# Patient Record
Sex: Male | Born: 1955
Health system: Southern US, Community
[De-identification: ages and names within clinical notes are randomized; demographics above are authoritative.]

## PROBLEM LIST (undated history)

## (undated) DIAGNOSIS — M51369 Other intervertebral disc degeneration, lumbar region without mention of lumbar back pain or lower extremity pain: Secondary | ICD-10-CM

## (undated) DIAGNOSIS — I700 Atherosclerosis of aorta: Secondary | ICD-10-CM

## (undated) DIAGNOSIS — L720 Epidermal cyst: Secondary | ICD-10-CM

## (undated) DIAGNOSIS — M5136 Other intervertebral disc degeneration, lumbar region: Secondary | ICD-10-CM

## (undated) DIAGNOSIS — K760 Fatty (change of) liver, not elsewhere classified: Secondary | ICD-10-CM

## (undated) DIAGNOSIS — K4021 Bilateral inguinal hernia, without obstruction or gangrene, recurrent: Secondary | ICD-10-CM

## (undated) DIAGNOSIS — Z86010 Personal history of colonic polyps: Secondary | ICD-10-CM

## (undated) DIAGNOSIS — F341 Dysthymic disorder: Secondary | ICD-10-CM

## (undated) DIAGNOSIS — F419 Anxiety disorder, unspecified: Secondary | ICD-10-CM

## (undated) DIAGNOSIS — M4802 Spinal stenosis, cervical region: Secondary | ICD-10-CM

## (undated) DIAGNOSIS — M25552 Pain in left hip: Secondary | ICD-10-CM

## (undated) DIAGNOSIS — F5105 Insomnia due to other mental disorder: Secondary | ICD-10-CM

## (undated) DIAGNOSIS — Z860101 Personal history of adenomatous and serrated colon polyps: Secondary | ICD-10-CM

## (undated) DIAGNOSIS — R1084 Generalized abdominal pain: Secondary | ICD-10-CM

## (undated) DIAGNOSIS — F411 Generalized anxiety disorder: Secondary | ICD-10-CM

## (undated) DIAGNOSIS — E669 Obesity, unspecified: Secondary | ICD-10-CM

## (undated) DIAGNOSIS — E66811 Obesity, class 1: Secondary | ICD-10-CM

## (undated) DIAGNOSIS — M170 Bilateral primary osteoarthritis of knee: Secondary | ICD-10-CM

## (undated) DIAGNOSIS — J309 Allergic rhinitis, unspecified: Secondary | ICD-10-CM

## (undated) HISTORY — DX: Generalized anxiety disorder: F41.1

## (undated) HISTORY — PX: KNEE ARTHROSCOPY: SUR90

## (undated) HISTORY — DX: Obesity, unspecified: E66.9

## (undated) HISTORY — DX: Dysthymic disorder: F34.1

## (undated) HISTORY — DX: Generalized abdominal pain: R10.84

## (undated) HISTORY — DX: Bilateral inguinal hernia, without obstruction or gangrene, recurrent: K40.21

## (undated) HISTORY — DX: Bilateral primary osteoarthritis of knee: M17.0

## (undated) HISTORY — DX: Pain in left hip: M25.552

## (undated) HISTORY — DX: Allergic rhinitis, unspecified: J30.9

## (undated) HISTORY — DX: Spinal stenosis, cervical region: M48.02

## (undated) HISTORY — PX: TOTAL SHOULDER REPLACEMENT: SUR1217

## (undated) HISTORY — DX: Personal history of adenomatous and serrated colon polyps: Z86.0101

## (undated) HISTORY — DX: Other intervertebral disc degeneration, lumbar region without mention of lumbar back pain or lower extremity pain: M51.369

## (undated) HISTORY — DX: Personal history of colonic polyps: Z86.010

## (undated) HISTORY — DX: Insomnia due to other mental disorder: F51.05

## (undated) HISTORY — DX: Obesity, class 1: E66.811

## (undated) HISTORY — PX: COLONOSCOPY: SHX174

## (undated) HISTORY — DX: Anxiety disorder, unspecified: F41.9

## (undated) HISTORY — DX: Epidermal cyst: L72.0

## (undated) HISTORY — DX: Other intervertebral disc degeneration, lumbar region: M51.36

---
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
## 1983-10-22 HISTORY — PX: TONSILLECTOMY AND ADENOIDECTOMY: SHX28

## 2010-10-21 HISTORY — PX: BACK SURGERY: SHX140

## 2011-05-02 DIAGNOSIS — M5416 Radiculopathy, lumbar region: Secondary | ICD-10-CM | POA: Insufficient documentation

## 2012-02-13 LAB — CBC AND DIFFERENTIAL
HCT: 45 (ref 41–53)
Hemoglobin: 14.9 (ref 13.5–17.5)
Neutrophils Absolute: 5
Platelets: 175 (ref 150–399)
WBC: 9.1

## 2012-02-13 LAB — HEPATIC FUNCTION PANEL
ALT: 48 — AB (ref 10–40)
AST: 17 (ref 14–40)
Alkaline Phosphatase: 88 (ref 25–125)
Bilirubin, Total: 0.6

## 2012-02-13 LAB — BASIC METABOLIC PANEL
BUN: 14 (ref 4–21)
CREATININE: 1 (ref <=1.3)
GLUCOSE: 83
POTASSIUM: 3.9 (ref 3.4–5.3)
SODIUM: 145 (ref 137–147)

## 2012-02-13 LAB — LIPID PANEL
CHOLESTEROL: 197 (ref 0–200)
HDL: 59 (ref 35–70)
LDL Cholesterol: 122
Triglycerides: 81 (ref 40–160)

## 2012-02-13 LAB — PSA: PSA: 1.22

## 2012-02-13 LAB — TSH: TSH: 2.78 (ref <=5.90)

## 2012-05-20 LAB — HM COLONOSCOPY

## 2017-05-28 ENCOUNTER — Encounter: Payer: Self-pay | Admitting: *Deleted

## 2017-05-28 ENCOUNTER — Telehealth: Payer: Self-pay | Admitting: *Deleted

## 2017-05-28 NOTE — Telephone Encounter (Signed)
New Pt apt 06/05/17.  Received records form [facility] - Dr. Modesto Charon.  I have reviewed these records and abstracted information into pts chart.   Records have been put on Dr. Isla Pence desk for review.

## 2017-06-01 ENCOUNTER — Encounter: Payer: Self-pay | Admitting: Family Medicine

## 2017-06-04 NOTE — Progress Notes (Signed)
Office Note 06/05/2017  CC:  Chief Complaint  Patient presents with  . Establish Care  . Abdominal Pain    lower, more left side than right, hernia?  . Neck Pain    left side   HPI:  Anthony Barnett is a 61 y.o. male who is here to establish care Patient's most recent primary MD: Dr. Drucilla Chalet in [place]. Old records from Dr. Felipa Emory in [facility] were reviewed prior to or during today's visit.  Has been having an uncomfortable feeling in abd 3-4 mo ago, constant, more L than R side.  Varies in intensity, describes it as vague "discomfort".  No change in relation to meals. Some burn on L lower/groin area where he had hernia repair in the remote past.  Worse when he gets tired after lots of activity.  No n/v.  No constip/diarrhea, or bloody/melanotic stools.  No meds taken for the sx's.   Has been physical more lately with moving from [place] --around the time this started. He has never had this before. No abnl weight loss. Takes 3-4 tabs twice a day ibuprofen.  Past Medical History:  Diagnosis Date  . DDD (degenerative disc disease), lumbar   . Dysthymia   . Epidermoid cyst of skin   . GAD (generalized anxiety disorder)   . Hx of adenomatous polyp of colon 2008; 2013   Recall 05/2017 (Dr. Hervey Ard, [place], [place].  . Insomnia secondary to anxiety    stress mainly related to work    Past Surgical History:  Procedure Laterality Date  . APPENDECTOMY  1968  . BACK SURGERY  2012  . COLONOSCOPY  2008'; 05/20/2012   2008; adenomatous polyps.  2013 Tubular adenoma:--recall 5 yrs.    Diverticulosis - Dr. Kellie Moor  . HERNIA REPAIR  1977   L inguinal  . KNEE SURGERY     L x 2, R x1  . TONSILLECTOMY AND ADENOIDECTOMY  1985    Family History  Problem Relation Age of Onset  . Colon cancer Maternal Grandfather 43    Social History   Social History  . Marital status: Married    Spouse name: N/A  . Number of children: N/A   . Years of education: N/A   Occupational History  . Not on file.   Social History Main Topics  . Smoking status: Never Smoker  . Smokeless tobacco: Never Used  . Alcohol use 1.8 oz/week    3 Glasses of wine per week  . Drug use: No  . Sexual activity: Not on file   Other Topics Concern  . Not on file   Social History Narrative   Married, 1 daughter and 1 son.   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   No tobacco.   Alc: occ wine.    Outpatient Encounter Prescriptions as of 06/05/2017  Medication Sig  . [DISCONTINUED] Ibuprofen (ADVIL PO) Take 1 tablet by mouth 2 (two) times daily.  . pantoprazole (PROTONIX) 40 MG tablet Take 1 tablet (40 mg total) by mouth daily.  . [DISCONTINUED] meloxicam (MOBIC) 15 MG tablet Take 1 tablet (15 mg total) by mouth daily.   No facility-administered encounter medications on file as of 06/05/2017.     No Known Allergies  ROS Review of Systems  Constitutional: Negative for appetite change, chills, fatigue and fever.  HENT: Negative for congestion, dental problem, ear pain and sore throat.   Eyes: Negative for discharge, redness and visual  disturbance.  Respiratory: Negative for cough, chest tightness, shortness of breath and wheezing.   Cardiovascular: Negative for chest pain, palpitations and leg swelling.  Gastrointestinal: Negative for abdominal distention, blood in stool, diarrhea, nausea and vomiting.  Genitourinary: Negative for difficulty urinating, dysuria, flank pain, frequency, hematuria and urgency.  Musculoskeletal: Negative for arthralgias, back pain, joint swelling, myalgias and neck stiffness.  Skin: Negative for pallor and rash.  Neurological: Negative for dizziness, speech difficulty, weakness and headaches.  Hematological: Negative for adenopathy. Does not bruise/bleed easily.  Psychiatric/Behavioral: Negative for confusion and sleep disturbance. The patient is not nervous/anxious.      PE; Blood pressure 125/82, pulse 73, temperature 98.5 F (36.9 C), temperature source Oral, resp. rate 16, height 5' 11.5" (1.816 m), weight 245 lb 12 oz (111.5 kg), SpO2 96 %. Body mass index is 33.8 kg/m.  Gen: Alert, well appearing.  Patient is oriented to person, place, time, and situation. AFFECT: pleasant, lucid thought and speech. Neck - No masses or thyromegaly or limitation in range of motion CV: RRR, no m/r/g.   LUNGS: CTA bilat, nonlabored resps, good aeration in all lung fields. ABD: soft, nondistended, BS normal, no HSM, bruit, or mass.  He has diffuse TTP all over abdomen, worse on L side, particularly LUQ.  No guarding or rebound tenderness.  With abd crunch while I'm palpating abdomen, his tenderness lessens a little bit. Left groin: subtle palpable fullness of tissue in area of prior L inguinal hernia.  Minimal tenderness here.  Scrotum/testicles normal.  With coughing forcefully, the left groin tissue does not change.   EXT: no clubbing, cyanosis, or edema.   Pertinent labs:  None today.  ASSESSMENT AND PLAN:   New pt; reviewed prior PCP records.  1) Abd pain/discomfort; diffuse, but LUQ and LLQ worse. Given his ibuprofen use, he very possibly has gastritis/dyspepsia, but this would not likely cause LLQ pain. Question slight recurrence of L inguinal hernia.   Will treat with pantoprazole 40 mg qd and stop all NSAIDs.  Check CBC w/diff, CMET, lipase, and ESR. We'll see if he gets some improvement in sx's over the next 3-4 weeks, keeping in mind we may have to look further into possible small recurrence of L inguinal hernia.  An After Visit Summary was printed and given to the patient.  Return for 3-4 week f/u abd problems.  (Of note, his last CPE with labs was >5 yrs ago per his report today, and he is due for repeat colonoscopy, so CPE with fasting labs + GI referral will need to be done pretty soon).  Signed:  Crissie Sickles, MD           06/05/2017

## 2017-06-05 ENCOUNTER — Encounter: Payer: Self-pay | Admitting: Family Medicine

## 2017-06-05 ENCOUNTER — Ambulatory Visit (INDEPENDENT_AMBULATORY_CARE_PROVIDER_SITE_OTHER): Payer: BLUE CROSS/BLUE SHIELD | Admitting: Family Medicine

## 2017-06-05 VITALS — BP 125/82 | HR 73 | Temp 98.5°F | Resp 16 | Ht 71.5 in | Wt 245.8 lb

## 2017-06-05 DIAGNOSIS — R1084 Generalized abdominal pain: Secondary | ICD-10-CM

## 2017-06-05 DIAGNOSIS — K2900 Acute gastritis without bleeding: Secondary | ICD-10-CM

## 2017-06-05 LAB — LIPASE: Lipase: 29 U/L (ref 11.0–59.0)

## 2017-06-05 LAB — SEDIMENTATION RATE: Sed Rate: 6 mm/hr (ref 0–20)

## 2017-06-05 LAB — CBC WITH DIFFERENTIAL/PLATELET
BASOS PCT: 0.4 % (ref 0.0–3.0)
Basophils Absolute: 0 10*3/uL (ref 0.0–0.1)
EOS PCT: 1.3 % (ref 0.0–5.0)
Eosinophils Absolute: 0.1 10*3/uL (ref 0.0–0.7)
HCT: 39.2 % (ref 39.0–52.0)
Hemoglobin: 13.1 g/dL (ref 13.0–17.0)
LYMPHS ABS: 2.5 10*3/uL (ref 0.7–4.0)
Lymphocytes Relative: 33.3 % (ref 12.0–46.0)
MCHC: 33.3 g/dL (ref 30.0–36.0)
MCV: 92.1 fl (ref 78.0–100.0)
MONO ABS: 0.6 10*3/uL (ref 0.1–1.0)
MONOS PCT: 8.4 % (ref 3.0–12.0)
NEUTROS ABS: 4.3 10*3/uL (ref 1.4–7.7)
NEUTROS PCT: 56.6 % (ref 43.0–77.0)
PLATELETS: 160 10*3/uL (ref 150.0–400.0)
RBC: 4.26 Mil/uL (ref 4.22–5.81)
RDW: 13.4 % (ref 11.5–15.5)
WBC: 7.6 10*3/uL (ref 4.0–10.5)

## 2017-06-05 LAB — COMPREHENSIVE METABOLIC PANEL
ALK PHOS: 63 U/L (ref 39–117)
ALT: 31 U/L (ref 0–53)
AST: 21 U/L (ref 0–37)
Albumin: 3.8 g/dL (ref 3.5–5.2)
BUN: 20 mg/dL (ref 6–23)
CO2: 31 meq/L (ref 19–32)
Calcium: 9 mg/dL (ref 8.4–10.5)
Chloride: 106 mEq/L (ref 96–112)
Creatinine, Ser: 0.84 mg/dL (ref 0.40–1.50)
GFR: 99.06 mL/min (ref >60.00)
GLUCOSE: 93 mg/dL (ref 70–99)
POTASSIUM: 4.3 meq/L (ref 3.5–5.1)
SODIUM: 142 meq/L (ref 135–145)
TOTAL PROTEIN: 6.1 g/dL (ref 6.0–8.3)
Total Bilirubin: 0.4 mg/dL (ref 0.2–1.2)

## 2017-06-05 MED ORDER — MELOXICAM 15 MG PO TABS: 15.0000 mg | ORAL_TABLET | Freq: Every day | ORAL | 0 refills | Status: DC

## 2017-06-05 MED ORDER — PANTOPRAZOLE SODIUM 40 MG PO TBEC: 40.0000 mg | DELAYED_RELEASE_TABLET | Freq: Every day | ORAL | 3 refills | Status: DC

## 2017-06-10 ENCOUNTER — Other Ambulatory Visit: Payer: Self-pay | Admitting: *Deleted

## 2017-06-10 NOTE — Telephone Encounter (Signed)
Opened in error

## 2017-06-24 ENCOUNTER — Ambulatory Visit: Payer: BLUE CROSS/BLUE SHIELD | Admitting: Family Medicine

## 2017-07-31 ENCOUNTER — Ambulatory Visit (INDEPENDENT_AMBULATORY_CARE_PROVIDER_SITE_OTHER): Payer: BLUE CROSS/BLUE SHIELD

## 2017-07-31 DIAGNOSIS — Z23 Encounter for immunization: Secondary | ICD-10-CM

## 2017-08-01 DIAGNOSIS — M1711 Unilateral primary osteoarthritis, right knee: Secondary | ICD-10-CM | POA: Diagnosis not present

## 2017-08-01 DIAGNOSIS — M1712 Unilateral primary osteoarthritis, left knee: Secondary | ICD-10-CM | POA: Diagnosis not present

## 2017-08-04 ENCOUNTER — Telehealth: Payer: Self-pay | Admitting: *Deleted

## 2017-08-04 ENCOUNTER — Telehealth: Payer: Self-pay | Admitting: Internal Medicine

## 2017-08-04 ENCOUNTER — Ambulatory Visit (INDEPENDENT_AMBULATORY_CARE_PROVIDER_SITE_OTHER): Payer: BLUE CROSS/BLUE SHIELD | Admitting: Family Medicine

## 2017-08-04 ENCOUNTER — Encounter: Payer: Self-pay | Admitting: Family Medicine

## 2017-08-04 VITALS — BP 145/86 | HR 67 | Temp 98.2°F | Resp 16 | Ht 71.5 in | Wt 241.5 lb

## 2017-08-04 DIAGNOSIS — Z01818 Encounter for other preprocedural examination: Secondary | ICD-10-CM

## 2017-08-04 DIAGNOSIS — Z86010 Personal history of colonic polyps: Secondary | ICD-10-CM

## 2017-08-04 NOTE — Progress Notes (Signed)
Office Note 08/04/2017  CC:  Chief Complaint  Patient presents with  . Medical Clearance    Left total knee arthroplasty    HPI:  Anthony Barnett is a 61 y.o. White male who is here for medical clearance for a left TKA that is not scheduled yet (Dr. Lyla Glassing). Has had no problems with prior surgeries Physical activity somewhat limited due to bilat knee pains. He has no SOB or CP or other sx's with sex, walking up stairs, doing vigorous yard work. No hx of CP issues or DOE. No hx of CAD, heart valve issues, or impaired LV function.  He takes no meds regularly. Checks bp regularly at home and says it is always 120s/70s.  Past Medical History:  Diagnosis Date  . DDD (degenerative disc disease), lumbar   . Dysthymia   . Epidermoid cyst of skin   . GAD (generalized anxiety disorder)   . Hx of adenomatous polyp of colon 2008; 2013   Recall 05/2017 (Dr. Hervey Ard, [place], [place].  . Insomnia secondary to anxiety    stress mainly related to work    Past Surgical History:  Procedure Laterality Date  . APPENDECTOMY  1968  . BACK SURGERY  2012  . COLONOSCOPY  2008'; 05/20/2012   2008; adenomatous polyps.  2013 Tubular adenoma:--recall 5 yrs.    Diverticulosis - Dr. Kellie Moor  . HERNIA REPAIR  1977   L inguinal  . KNEE SURGERY     L x 2, R x1  . TONSILLECTOMY AND ADENOIDECTOMY  1985    Family History  Problem Relation Age of Onset  . Colon cancer Maternal Grandfather 79    Social History   Social History  . Marital status: Married    Spouse name: N/A  . Number of children: N/A  . Years of education: N/A   Occupational History  . Not on file.   Social History Main Topics  . Smoking status: Never Smoker  . Smokeless tobacco: Never Used  . Alcohol use 1.8 oz/week    3 Glasses of wine per week  . Drug use: No  . Sexual activity: Not on file   Other Topics Concern  . Not on file   Social History Narrative   Married, 1  daughter and 1 son.   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   No tobacco.   Alc: occ wine.    Outpatient Medications Prior to Visit  Medication Sig Dispense Refill  . pantoprazole (PROTONIX) 40 MG tablet Take 1 tablet (40 mg total) by mouth daily. (Patient not taking: Reported on 08/04/2017) 30 tablet 3   No facility-administered medications prior to visit.     No Known Allergies  ROS Review of Systems  Constitutional: Negative for appetite change, chills, fatigue and fever.  HENT: Negative for congestion, dental problem, ear pain and sore throat.   Eyes: Negative for discharge, redness and visual disturbance.  Respiratory: Negative for cough, chest tightness, shortness of breath and wheezing.   Cardiovascular: Negative for chest pain, palpitations and leg swelling.  Gastrointestinal: Negative for abdominal pain, blood in stool, diarrhea, nausea and vomiting.  Genitourinary: Negative for difficulty urinating, dysuria, flank pain, frequency, hematuria and urgency.  Musculoskeletal: Positive for arthralgias (bilat knees--chronic). Negative for back pain, joint swelling, myalgias and neck stiffness.  Skin: Negative for pallor and rash.  Neurological: Negative for dizziness, speech difficulty, weakness and headaches.  Hematological: Negative for adenopathy. Does not bruise/bleed easily.  Psychiatric/Behavioral: Negative for confusion and sleep disturbance. The patient is not nervous/anxious.     PE; Blood pressure (!) 145/86, pulse 67, temperature 98.2 F (36.8 C), temperature source Oral, resp. rate 16, height 5' 11.5" (1.816 m), weight 241 lb 8 oz (109.5 kg), SpO2 96 %. Gen: Alert, well appearing.  Patient is oriented to person, place, time, and situation. AFFECT: pleasant, lucid thought and speech. ENT:  Eyes: no injection, icteris, swelling, or exudate.  EOMI, PERRLA. Nose: no drainage or turbinate edema/swelling.  No injection or focal lesion.   Mouth: lips without lesion/swelling.  Oral mucosa pink and moist.  Dentition intact and without obvious caries or gingival swelling.  Oropharynx without erythema, exudate, or swelling.  Neck: supple/nontender.  No LAD, mass, or TM.  Carotid pulses 2+ bilaterally, without bruits. CV: RRR, no m/r/g.   LUNGS: CTA bilat, nonlabored resps, good aeration in all lung fields. ABD: soft, NT, ND, BS normal.  No hepatospenomegaly or mass.  No bruits. EXT: no clubbing, cyanosis, or edema.  Musculoskeletal: no joint swelling, erythema, warmth, or tenderness.  ROM of all joints intact. Skin - no sores or suspicious lesions or rashes or color changes   Pertinent labs:  Lab Results  Component Value Date   TSH 2.78 02/13/2012   Lab Results  Component Value Date   WBC 7.6 06/05/2017   HGB 13.1 06/05/2017   HCT 39.2 06/05/2017   MCV 92.1 06/05/2017   PLT 160.0 06/05/2017   Lab Results  Component Value Date   CREATININE 0.84 06/05/2017   BUN 20 06/05/2017   NA 142 06/05/2017   K 4.3 06/05/2017   CL 106 06/05/2017   CO2 31 06/05/2017   Lab Results  Component Value Date   ALT 31 06/05/2017   AST 21 06/05/2017   ALKPHOS 63 06/05/2017   BILITOT 0.4 06/05/2017   Lab Results  Component Value Date   CHOL 197 02/13/2012   Lab Results  Component Value Date   HDL 59 02/13/2012   Lab Results  Component Value Date   LDLCALC 122 02/13/2012   Lab Results  Component Value Date   TRIG 81 02/13/2012   No results found for: [facility] Lab Results  Component Value Date   PSA 1.22 02/13/2012   ASSESSMENT AND PLAN:   1) Pre-operative medical clearance for TKA. He has no symptoms or signs of cardiopulmonary dz, no need for any w/u along these lines. He has had normal CBC and CMET 2 mo ago. He takes no medications. He is cleared for surgery w/out any specific recommendations.  2) Colon polyps, hx of.  He is due for his 5 yr f/u colonoscopy. Will refer to local GI for this.  An After Visit  Summary was printed and given to the patient.  FOLLOW UP:  Return in about 1 year (around 08/04/2018) for annual CPE (fasting).  Signed:  Crissie Sickles, MD           08/04/2017

## 2017-08-04 NOTE — Telephone Encounter (Signed)
Medical Clearance form completed and faxed to Dr. Sherrie George office.   Original form sent to scan. Copy made and put in fax pile, will be put in shred box x 1 month.

## 2017-08-06 ENCOUNTER — Ambulatory Visit: Payer: Self-pay | Admitting: Orthopedic Surgery

## 2017-08-06 NOTE — Telephone Encounter (Signed)
Called patient and LM on Vmail.  Dr. Hilarie Fredrickson is requesting Pathology results from previous Colon and they are not scanned in [facility]

## 2017-08-26 NOTE — Telephone Encounter (Signed)
LM on Vmail to check the status of Pathology report from 2013.

## 2017-09-04 NOTE — Pre-Procedure Instructions (Signed)
Anthony Barnett  09/04/2017      MED[facility] [place], [place] - [address] [place] [address] Phone: [phone] Fax: [phone]    Your procedure is scheduled on Monday, September 15, 2017  Report to [facility] Admitting Entrance "A" at 1:40P.M.   Call this number if you have problems the morning of surgery:  [phone]   Remember:  Do not eat food or drink liquids after midnight.  Take these medicines the morning of surgery with A SIP OF WATER: None  As of today, stop taking all Aspirins, Vitamins, Fish oils, and Herbal medications. Also stop all NSAIDS i.e. Advil, Ibuprofen, Motrin, Aleve, Anaprox, Naproxen, BC and Goody Powders.   Do not wear jewelry.  Do not wear lotions, powders, colognes, or deodorant.  Do not shave 48 hours prior to surgery.  Men may shave face and neck.  Do not bring valuables to the hospital.  [facility] is not responsible for any belongings or valuables.  Contacts, dentures or bridgework may not be worn into surgery.  Leave your suitcase in the car.  After surgery it may be brought to your room.  For patients admitted to the hospital, discharge time will be determined by your treatment team.  Patients discharged the day of surgery will not be allowed to drive home.   Special instructions:  [place]- Preparing For Surgery  Before surgery, you can play an important role. Because skin is not sterile, your skin needs to be as free of germs as possible. You can reduce the number of germs on your skin by washing with CHG (chlorahexidine gluconate) Soap before surgery.  CHG is an antiseptic cleaner which kills germs and bonds with the skin to continue killing germs even after washing.  Please do not use if you have an allergy to CHG or antibacterial soaps. If your skin becomes reddened/irritated stop using the CHG.  Do not shave (including legs and underarms) for at least 48 hours prior to first CHG shower.  It is OK to shave your face.  Please follow these instructions carefully.   1. Shower the NIGHT BEFORE SURGERY and the MORNING OF SURGERY with CHG.   2. If you chose to wash your hair, wash your hair first as usual with your normal shampoo.  3. After you shampoo, rinse your hair and body thoroughly to remove the shampoo.  4. Use CHG as you would any other liquid soap. You can apply CHG directly to the skin and wash gently with a scrungie or a clean washcloth.   5. Apply the CHG Soap to your body ONLY FROM THE NECK DOWN.  Do not use on open wounds or open sores. Avoid contact with your eyes, ears, mouth and genitals (private parts). Wash Face and genitals (private parts)  with your normal soap.  6. Wash thoroughly, paying special attention to the area where your surgery will be performed.  7. Thoroughly rinse your body with warm water from the neck down.  8. DO NOT shower/wash with your normal soap after using and rinsing off the CHG Soap.  9. Pat yourself dry with a CLEAN TOWEL.  10. Wear CLEAN PAJAMAS to bed the night before surgery, wear comfortable clothes the morning of surgery  11. Place CLEAN SHEETS on your bed the night of your first shower and DO NOT SLEEP WITH PETS.  Day of Surgery: Do not apply any deodorants/lotions. Please wear clean clothes to  the hospital/surgery center.    Please read over the following fact sheets that you were given. Pain Booklet, Coughing and Deep Breathing, MRSA Information and Surgical Site Infection Prevention

## 2017-09-05 ENCOUNTER — Encounter (HOSPITAL_COMMUNITY)
Admission: RE | Admit: 2017-09-05 | Discharge: 2017-09-05 | Disposition: A | Discharge status: Home / Self Care | Payer: BLUE CROSS/BLUE SHIELD | Source: Ambulatory Visit | Attending: Orthopedic Surgery | Admitting: Orthopedic Surgery

## 2017-09-05 ENCOUNTER — Other Ambulatory Visit: Payer: Self-pay

## 2017-09-05 ENCOUNTER — Encounter (HOSPITAL_COMMUNITY): Payer: Self-pay

## 2017-09-05 DIAGNOSIS — Z01812 Encounter for preprocedural laboratory examination: Secondary | ICD-10-CM | POA: Insufficient documentation

## 2017-09-05 LAB — CBC
HCT: 43.3 % (ref 39.0–52.0)
Hemoglobin: 14.6 g/dL (ref 13.0–17.0)
MCH: 30.9 pg (ref 26.0–34.0)
MCHC: 33.7 g/dL (ref 30.0–36.0)
MCV: 91.7 fL (ref 78.0–100.0)
PLATELETS: 143 10*3/uL — AB (ref 150–400)
RBC: 4.72 MIL/uL (ref 4.22–5.81)
RDW: 13.5 % (ref 11.5–15.5)
WBC: 7.1 10*3/uL (ref 4.0–10.5)

## 2017-09-05 LAB — BASIC METABOLIC PANEL
ANION GAP: 5 (ref 5–15)
BUN: 16 mg/dL (ref 6–20)
CALCIUM: 8.8 mg/dL — AB (ref 8.9–10.3)
CO2: 27 mmol/L (ref 22–32)
CREATININE: 0.89 mg/dL (ref 0.61–1.24)
Chloride: 107 mmol/L (ref 101–111)
GFR calc Af Amer: >60 mL/min (ref >60)
GLUCOSE: 99 mg/dL (ref 65–99)
Potassium: 4.4 mmol/L (ref 3.5–5.1)
Sodium: 139 mmol/L (ref 135–145)

## 2017-09-05 LAB — SURGICAL PCR SCREEN
MRSA, PCR: NEGATIVE
Staphylococcus aureus: NEGATIVE

## 2017-09-05 LAB — TYPE AND SCREEN
ABO/RH(D): A POS
Antibody Screen: NEGATIVE

## 2017-09-05 LAB — ABO/RH: ABO/RH(D): A POS

## 2017-09-05 NOTE — Progress Notes (Signed)
PCP - Dr. Patrcia Dolly- [place]  Cardiologist - Denies  Chest x-ray - Denies  EKG - Denies  Stress Test - Denies  ECHO - Denies  Cardiac Cath - Denies  Sleep Study - No CPAP - None    Pt denies having chest pain, sob, or fever at this time. All instructions explained to the pt, with a verbal understanding of the material. Pt agrees to go over the instructions while at home for a better understanding. The opportunity to ask questions was provided.

## 2017-09-09 ENCOUNTER — Ambulatory Visit: Payer: Self-pay | Admitting: Orthopedic Surgery

## 2017-09-09 NOTE — H&P (View-Only) (Signed)
TOTAL KNEE ADMISSION H&P  Patient is being admitted for left total knee arthroplasty.  Subjective:  Chief Complaint:left knee pain.  HPI: Anthony Barnett, 61 y.o. male, has a history of pain and functional disability in the left knee due to arthritis and has failed non-surgical conservative treatments for greater than 12 weeks to includeNSAID's and/or analgesics, corticosteriod injections, flexibility and strengthening excercises, use of assistive devices, weight reduction as appropriate and activity modification.  Onset of symptoms was gradual, starting >10 years ago with gradually worsening course since that time. The patient noted prior procedures on the knee to include  arthroscopy and menisectomy on the left knee(s).  Patient currently rates pain in the left knee(s) at 10 out of 10 with activity. Patient has night pain, worsening of pain with activity and weight bearing, pain that interferes with activities of daily living, pain with passive range of motion, crepitus and joint swelling.  Patient has evidence of subchondral cysts, subchondral sclerosis, periarticular osteophytes and joint space narrowing by imaging studies.  There is no active infection.  There are no active problems to display for this patient.  Past Medical History:  Diagnosis Date  . Arthritis of knee, left   . DDD (degenerative disc disease), lumbar   . Dysthymia   . Epidermoid cyst of skin   . GAD (generalized anxiety disorder)   . Hx of adenomatous polyp of colon 2008; 2013   Recall 05/2017 (Dr. Hervey Ard, [place], [place].  . Insomnia secondary to anxiety    stress mainly related to work    Past Surgical History:  Procedure Laterality Date  . APPENDECTOMY  1968  . BACK SURGERY  2012  . COLONOSCOPY  2008'; 05/20/2012   2008; adenomatous polyps.  2013 Tubular adenoma:--recall 5 yrs.    Diverticulosis - Dr. Kellie Moor  . HERNIA REPAIR  1977   L inguinal  . JOINT REPLACEMENT    .  KNEE ARTHROSCOPY     Bilateral  . KNEE SURGERY     L x 2, R x1  . TONSILLECTOMY AND ADENOIDECTOMY  1985  . TOTAL SHOULDER REPLACEMENT     Left    Current Outpatient Medications  Medication Sig Dispense Refill Last Dose  . ibuprofen (ADVIL,MOTRIN) 200 MG tablet Take 400 mg every 8 (eight) hours as needed by mouth for mild pain (takes at least once every day).      No current facility-administered medications for this visit.    No Known Allergies  Social History   Tobacco Use  . Smoking status: Never Smoker  . Smokeless tobacco: Never Used  Substance Use Topics  . Alcohol use: Yes    Alcohol/week: 1.8 oz    Types: 3 Glasses of wine per week    Family History  Problem Relation Age of Onset  . Colon cancer Maternal Grandfather 85     Review of Systems  Constitutional: Negative.   HENT: Negative.   Eyes: Negative.   Respiratory: Negative.   Cardiovascular: Negative.   Gastrointestinal: Negative.   Genitourinary: Negative.   Musculoskeletal: Positive for joint pain.  Skin: Negative.   Neurological: Negative.   Endo/Heme/Allergies: Negative.   Psychiatric/Behavioral: Negative.     Objective:  Physical Exam  Vitals reviewed. Constitutional: He is oriented to person, place, and time. He appears well-developed and well-nourished.  HENT:  Head: Normocephalic and atraumatic.  Eyes: Conjunctivae and EOM are normal. Pupils are equal, round, and reactive to light.  Neck: Normal range of motion. Neck supple. No thyromegaly present.  Cardiovascular: Normal rate, regular rhythm and intact distal pulses.  Respiratory: Effort normal. No respiratory distress.  Genitourinary:  Genitourinary Comments: deferred  Musculoskeletal:       Left knee: He exhibits decreased range of motion, swelling, effusion, abnormal alignment and bony tenderness. Tenderness found. Medial joint line and lateral joint line tenderness noted.  Neurological: He is alert and oriented to person, place, and  time. He has normal reflexes.  Skin: Skin is warm and dry.  Psychiatric: He has a normal mood and affect. His behavior is normal. Judgment and thought content normal.    Vital signs in last 24 hours: @VSRANGES @  Labs:   Estimated body mass index is 33.32 kg/m as calculated from the following:   Height as of 09/05/17: 6' (1.829 m).   Weight as of 09/05/17: 111.4 kg (245 lb 11.2 oz).   Imaging Review Plain radiographs demonstrate severe degenerative joint disease of the left knee(s). The overall alignment issignificant varus. The bone quality appears to be adequate for age and reported activity level.  Assessment/Plan:  End stage arthritis, left knee   The patient history, physical examination, clinical judgment of the provider and imaging studies are consistent with end stage degenerative joint disease of the left knee(s) and total knee arthroplasty is deemed medically necessary. The treatment options including medical management, injection therapy arthroscopy and arthroplasty were discussed at length. The risks and benefits of total knee arthroplasty were presented and reviewed. The risks due to aseptic loosening, infection, stiffness, patella tracking problems, thromboembolic complications and other imponderables were discussed. The patient acknowledged the explanation, agreed to proceed with the plan and consent was signed. Patient is being admitted for inpatient treatment for surgery, pain control, PT, OT, prophylactic antibiotics, VTE prophylaxis, progressive ambulation and ADL's and discharge planning. The patient is planning to be discharged home with outpatient PT. Has DME.

## 2017-09-09 NOTE — H&P (Signed)
TOTAL KNEE ADMISSION H&P  Patient is being admitted for left total knee arthroplasty.  Subjective:  Chief Complaint:left knee pain.  HPI: Anthony Barnett, 61 y.o. male, has a history of pain and functional disability in the left knee due to arthritis and has failed non-surgical conservative treatments for greater than 12 weeks to includeNSAID's and/or analgesics, corticosteriod injections, flexibility and strengthening excercises, use of assistive devices, weight reduction as appropriate and activity modification.  Onset of symptoms was gradual, starting >10 years ago with gradually worsening course since that time. The patient noted prior procedures on the knee to include  arthroscopy and menisectomy on the left knee(s).  Patient currently rates pain in the left knee(s) at 10 out of 10 with activity. Patient has night pain, worsening of pain with activity and weight bearing, pain that interferes with activities of daily living, pain with passive range of motion, crepitus and joint swelling.  Patient has evidence of subchondral cysts, subchondral sclerosis, periarticular osteophytes and joint space narrowing by imaging studies.  There is no active infection.  There are no active problems to display for this patient.  Past Medical History:  Diagnosis Date  . Arthritis of knee, left   . DDD (degenerative disc disease), lumbar   . Dysthymia   . Epidermoid cyst of skin   . GAD (generalized anxiety disorder)   . Hx of adenomatous polyp of colon 2008; 2013   Recall 05/2017 (Dr. Hervey Ard, [place], [place].  . Insomnia secondary to anxiety    stress mainly related to work    Past Surgical History:  Procedure Laterality Date  . APPENDECTOMY  1968  . BACK SURGERY  2012  . COLONOSCOPY  2008'; 05/20/2012   2008; adenomatous polyps.  2013 Tubular adenoma:--recall 5 yrs.    Diverticulosis - Dr. Kellie Moor  . HERNIA REPAIR  1977   L inguinal  . JOINT REPLACEMENT    .  KNEE ARTHROSCOPY     Bilateral  . KNEE SURGERY     L x 2, R x1  . TONSILLECTOMY AND ADENOIDECTOMY  1985  . TOTAL SHOULDER REPLACEMENT     Left    Current Outpatient Medications  Medication Sig Dispense Refill Last Dose  . ibuprofen (ADVIL,MOTRIN) 200 MG tablet Take 400 mg every 8 (eight) hours as needed by mouth for mild pain (takes at least once every day).      No current facility-administered medications for this visit.    No Known Allergies  Social History   Tobacco Use  . Smoking status: Never Smoker  . Smokeless tobacco: Never Used  Substance Use Topics  . Alcohol use: Yes    Alcohol/week: 1.8 oz    Types: 3 Glasses of wine per week    Family History  Problem Relation Age of Onset  . Colon cancer Maternal Grandfather 85     Review of Systems  Constitutional: Negative.   HENT: Negative.   Eyes: Negative.   Respiratory: Negative.   Cardiovascular: Negative.   Gastrointestinal: Negative.   Genitourinary: Negative.   Musculoskeletal: Positive for joint pain.  Skin: Negative.   Neurological: Negative.   Endo/Heme/Allergies: Negative.   Psychiatric/Behavioral: Negative.     Objective:  Physical Exam  Vitals reviewed. Constitutional: He is oriented to person, place, and time. He appears well-developed and well-nourished.  HENT:  Head: Normocephalic and atraumatic.  Eyes: Conjunctivae and EOM are normal. Pupils are equal, round, and reactive to light.  Neck: Normal range of motion. Neck supple. No thyromegaly present.  Cardiovascular: Normal rate, regular rhythm and intact distal pulses.  Respiratory: Effort normal. No respiratory distress.  Genitourinary:  Genitourinary Comments: deferred  Musculoskeletal:       Left knee: He exhibits decreased range of motion, swelling, effusion, abnormal alignment and bony tenderness. Tenderness found. Medial joint line and lateral joint line tenderness noted.  Neurological: He is alert and oriented to person, place, and  time. He has normal reflexes.  Skin: Skin is warm and dry.  Psychiatric: He has a normal mood and affect. His behavior is normal. Judgment and thought content normal.    Vital signs in last 24 hours: @VSRANGES @  Labs:   Estimated body mass index is 33.32 kg/m as calculated from the following:   Height as of 09/05/17: 6' (1.829 m).   Weight as of 09/05/17: 111.4 kg (245 lb 11.2 oz).   Imaging Review Plain radiographs demonstrate severe degenerative joint disease of the left knee(s). The overall alignment issignificant varus. The bone quality appears to be adequate for age and reported activity level.  Assessment/Plan:  End stage arthritis, left knee   The patient history, physical examination, clinical judgment of the provider and imaging studies are consistent with end stage degenerative joint disease of the left knee(s) and total knee arthroplasty is deemed medically necessary. The treatment options including medical management, injection therapy arthroscopy and arthroplasty were discussed at length. The risks and benefits of total knee arthroplasty were presented and reviewed. The risks due to aseptic loosening, infection, stiffness, patella tracking problems, thromboembolic complications and other imponderables were discussed. The patient acknowledged the explanation, agreed to proceed with the plan and consent was signed. Patient is being admitted for inpatient treatment for surgery, pain control, PT, OT, prophylactic antibiotics, VTE prophylaxis, progressive ambulation and ADL's and discharge planning. The patient is planning to be discharged home with outpatient PT. Has DME.

## 2017-09-12 MED ORDER — TRANEXAMIC ACID 1000 MG/10ML IV SOLN
1000.0000 mg | INTRAVENOUS | Status: AC
  Administered 2017-09-15: 1000 mg via INTRAVENOUS
  Filled 2017-09-12: qty 10

## 2017-09-12 MED ORDER — ACETAMINOPHEN 10 MG/ML IV SOLN
1000.0000 mg | INTRAVENOUS | Status: AC
  Administered 2017-09-15: 1000 mg via INTRAVENOUS
  Filled 2017-09-12: qty 100

## 2017-09-12 MED ORDER — CEFAZOLIN SODIUM-DEXTROSE 2-4 GM/100ML-% IV SOLN
2.0000 g | INTRAVENOUS | Status: AC
  Administered 2017-09-15: 2 g via INTRAVENOUS
  Filled 2017-09-12: qty 100

## 2017-09-12 MED ORDER — SODIUM CHLORIDE 0.9 % IV SOLN: INTRAVENOUS | Status: DC

## 2017-09-15 ENCOUNTER — Inpatient Hospital Stay (HOSPITAL_COMMUNITY): Payer: BLUE CROSS/BLUE SHIELD

## 2017-09-15 ENCOUNTER — Encounter (HOSPITAL_COMMUNITY): Payer: Self-pay | Admitting: Surgery

## 2017-09-15 ENCOUNTER — Inpatient Hospital Stay (HOSPITAL_COMMUNITY): Payer: BLUE CROSS/BLUE SHIELD | Admitting: Anesthesiology

## 2017-09-15 ENCOUNTER — Inpatient Hospital Stay (HOSPITAL_COMMUNITY)
Admission: RE | Admit: 2017-09-15 | Discharge: 2017-09-16 | DRG: 470 | Disposition: A | Discharge status: Home / Self Care | Payer: BLUE CROSS/BLUE SHIELD | Source: Ambulatory Visit | Attending: Orthopedic Surgery | Admitting: Orthopedic Surgery

## 2017-09-15 ENCOUNTER — Other Ambulatory Visit: Payer: Self-pay

## 2017-09-15 ENCOUNTER — Encounter (HOSPITAL_COMMUNITY)
Admission: RE | Disposition: A | Discharge status: Home / Self Care | Payer: Self-pay | Source: Ambulatory Visit | Attending: Orthopedic Surgery

## 2017-09-15 DIAGNOSIS — Z96612 Presence of left artificial shoulder joint: Secondary | ICD-10-CM | POA: Diagnosis not present

## 2017-09-15 DIAGNOSIS — Z791 Long term (current) use of non-steroidal anti-inflammatories (NSAID): Secondary | ICD-10-CM | POA: Diagnosis not present

## 2017-09-15 DIAGNOSIS — F411 Generalized anxiety disorder: Secondary | ICD-10-CM | POA: Diagnosis present

## 2017-09-15 DIAGNOSIS — F329 Major depressive disorder, single episode, unspecified: Secondary | ICD-10-CM | POA: Diagnosis not present

## 2017-09-15 DIAGNOSIS — M1712 Unilateral primary osteoarthritis, left knee: Secondary | ICD-10-CM | POA: Diagnosis present

## 2017-09-15 DIAGNOSIS — D571 Sickle-cell disease without crisis: Secondary | ICD-10-CM | POA: Diagnosis present

## 2017-09-15 DIAGNOSIS — Z96652 Presence of left artificial knee joint: Secondary | ICD-10-CM | POA: Diagnosis not present

## 2017-09-15 DIAGNOSIS — F5105 Insomnia due to other mental disorder: Secondary | ICD-10-CM | POA: Diagnosis not present

## 2017-09-15 DIAGNOSIS — Z471 Aftercare following joint replacement surgery: Secondary | ICD-10-CM | POA: Diagnosis not present

## 2017-09-15 DIAGNOSIS — G8918 Other acute postprocedural pain: Secondary | ICD-10-CM | POA: Diagnosis not present

## 2017-09-15 HISTORY — PX: KNEE ARTHROPLASTY: SHX992

## 2017-09-15 SURGERY — ARTHROPLASTY, KNEE, TOTAL, USING IMAGELESS COMPUTER-ASSISTED NAVIGATION
Anesthesia: Spinal | Site: Knee | Laterality: Left

## 2017-09-15 MED ORDER — METHOCARBAMOL 1000 MG/10ML IJ SOLN
500.0000 mg | Freq: Four times a day (QID) | INTRAVENOUS | Status: DC | PRN
  Filled 2017-09-15: qty 5

## 2017-09-15 MED ORDER — ACETAMINOPHEN 325 MG PO TABS: 650.0000 mg | ORAL_TABLET | ORAL | Status: DC | PRN

## 2017-09-15 MED ORDER — POVIDONE-IODINE 10 % EX SWAB: 2.0000 "application " | Freq: Once | CUTANEOUS | Status: DC

## 2017-09-15 MED ORDER — PHENOL 1.4 % MT LIQD: 1.0000 | OROMUCOSAL | Status: DC | PRN

## 2017-09-15 MED ORDER — OXYCODONE HCL 5 MG/5ML PO SOLN: 5.0000 mg | Freq: Once | ORAL | Status: DC | PRN

## 2017-09-15 MED ORDER — ONDANSETRON HCL 4 MG/2ML IJ SOLN
INTRAMUSCULAR | Status: DC | PRN
  Administered 2017-09-15: 4 mg via INTRAVENOUS

## 2017-09-15 MED ORDER — FENTANYL CITRATE (PF) 100 MCG/2ML IJ SOLN
100.0000 ug | Freq: Once | INTRAMUSCULAR | Status: AC
  Administered 2017-09-15: 100 ug via INTRAVENOUS

## 2017-09-15 MED ORDER — HYDROCODONE-ACETAMINOPHEN 5-325 MG PO TABS: 2.0000 | ORAL_TABLET | ORAL | Status: DC | PRN

## 2017-09-15 MED ORDER — ALUM & MAG HYDROXIDE-SIMETH 200-200-20 MG/5ML PO SUSP: 30.0000 mL | ORAL | Status: DC | PRN

## 2017-09-15 MED ORDER — SODIUM CHLORIDE 0.9 % IV SOLN
INTRAVENOUS | Status: DC
  Administered 2017-09-15 – 2017-09-16 (×2): via INTRAVENOUS

## 2017-09-15 MED ORDER — FENTANYL CITRATE (PF) 100 MCG/2ML IJ SOLN
INTRAMUSCULAR | Status: DC | PRN
  Administered 2017-09-15: 50 ug via INTRAVENOUS

## 2017-09-15 MED ORDER — FENTANYL CITRATE (PF) 250 MCG/5ML IJ SOLN
INTRAMUSCULAR | Status: AC
  Filled 2017-09-15: qty 5

## 2017-09-15 MED ORDER — METOCLOPRAMIDE HCL 5 MG PO TABS: 5.0000 mg | ORAL_TABLET | Freq: Three times a day (TID) | ORAL | Status: DC | PRN

## 2017-09-15 MED ORDER — ACETAMINOPHEN 650 MG RE SUPP: 650.0000 mg | RECTAL | Status: DC | PRN

## 2017-09-15 MED ORDER — SODIUM CHLORIDE 0.9 % IV SOLN
1000.0000 mg | Freq: Once | INTRAVENOUS | Status: AC
  Administered 2017-09-16: 1000 mg via INTRAVENOUS
  Filled 2017-09-15: qty 10

## 2017-09-15 MED ORDER — OXYCODONE HCL 5 MG PO TABS: 5.0000 mg | ORAL_TABLET | Freq: Once | ORAL | Status: DC | PRN

## 2017-09-15 MED ORDER — ASPIRIN 81 MG PO CHEW
81.0000 mg | CHEWABLE_TABLET | Freq: Two times a day (BID) | ORAL | Status: DC
  Administered 2017-09-16: 81 mg via ORAL
  Filled 2017-09-15: qty 1

## 2017-09-15 MED ORDER — DOCUSATE SODIUM 100 MG PO CAPS
100.0000 mg | ORAL_CAPSULE | Freq: Two times a day (BID) | ORAL | Status: DC
  Administered 2017-09-16 (×2): 100 mg via ORAL
  Filled 2017-09-15 (×2): qty 1

## 2017-09-15 MED ORDER — DIPHENHYDRAMINE HCL 12.5 MG/5ML PO ELIX
12.5000 mg | ORAL_SOLUTION | ORAL | Status: DC | PRN
Start: 2017-09-15 — End: 2017-09-16

## 2017-09-15 MED ORDER — HYDROCODONE-ACETAMINOPHEN 5-325 MG PO TABS: 1.0000 | ORAL_TABLET | ORAL | Status: DC | PRN

## 2017-09-15 MED ORDER — PROMETHAZINE HCL 25 MG/ML IJ SOLN: 6.2500 mg | INTRAMUSCULAR | Status: DC | PRN

## 2017-09-15 MED ORDER — METOCLOPRAMIDE HCL 5 MG/ML IJ SOLN: 5.0000 mg | Freq: Three times a day (TID) | INTRAMUSCULAR | Status: DC | PRN

## 2017-09-15 MED ORDER — FENTANYL CITRATE (PF) 100 MCG/2ML IJ SOLN
INTRAMUSCULAR | Status: AC
  Administered 2017-09-15: 100 ug via INTRAVENOUS
  Filled 2017-09-15: qty 2

## 2017-09-15 MED ORDER — METHOCARBAMOL 500 MG PO TABS: 500.0000 mg | ORAL_TABLET | Freq: Four times a day (QID) | ORAL | Status: DC | PRN

## 2017-09-15 MED ORDER — KETOROLAC TROMETHAMINE 30 MG/ML IJ SOLN
INTRAMUSCULAR | Status: AC
  Filled 2017-09-15: qty 1

## 2017-09-15 MED ORDER — POLYETHYLENE GLYCOL 3350 17 G PO PACK: 17.0000 g | PACK | Freq: Every day | ORAL | Status: DC | PRN

## 2017-09-15 MED ORDER — DEXAMETHASONE SODIUM PHOSPHATE 10 MG/ML IJ SOLN
10.0000 mg | Freq: Once | INTRAMUSCULAR | Status: AC
  Administered 2017-09-16: 10 mg via INTRAVENOUS
  Filled 2017-09-15: qty 1

## 2017-09-15 MED ORDER — PROPOFOL 10 MG/ML IV BOLUS
INTRAVENOUS | Status: DC | PRN
  Administered 2017-09-15 (×2): 20 mg via INTRAVENOUS

## 2017-09-15 MED ORDER — CHLORHEXIDINE GLUCONATE 4 % EX LIQD: 60.0000 mL | Freq: Once | CUTANEOUS | Status: DC

## 2017-09-15 MED ORDER — MIDAZOLAM HCL 2 MG/2ML IJ SOLN
2.0000 mg | Freq: Once | INTRAMUSCULAR | Status: AC
  Administered 2017-09-15: 2 mg via INTRAVENOUS

## 2017-09-15 MED ORDER — MENTHOL 3 MG MT LOZG: 1.0000 | LOZENGE | OROMUCOSAL | Status: DC | PRN

## 2017-09-15 MED ORDER — ONDANSETRON HCL 4 MG/2ML IJ SOLN: 4.0000 mg | Freq: Four times a day (QID) | INTRAMUSCULAR | Status: DC | PRN

## 2017-09-15 MED ORDER — LACTATED RINGERS IV SOLN
INTRAVENOUS | Status: DC
  Administered 2017-09-15 (×2): via INTRAVENOUS

## 2017-09-15 MED ORDER — MIDAZOLAM HCL 2 MG/2ML IJ SOLN
INTRAMUSCULAR | Status: AC
  Administered 2017-09-15: 2 mg via INTRAVENOUS
  Filled 2017-09-15: qty 2

## 2017-09-15 MED ORDER — BUPIVACAINE-EPINEPHRINE (PF) 0.5% -1:200000 IJ SOLN
INTRAMUSCULAR | Status: AC
  Filled 2017-09-15: qty 30

## 2017-09-15 MED ORDER — SENNA 8.6 MG PO TABS: 2.0000 | ORAL_TABLET | Freq: Every day | ORAL | Status: DC

## 2017-09-15 MED ORDER — KETOROLAC TROMETHAMINE 15 MG/ML IJ SOLN
15.0000 mg | Freq: Four times a day (QID) | INTRAMUSCULAR | Status: DC
  Administered 2017-09-15 – 2017-09-16 (×2): 15 mg via INTRAVENOUS
  Filled 2017-09-15 (×4): qty 1

## 2017-09-15 MED ORDER — BUPIVACAINE-EPINEPHRINE (PF) 0.5% -1:200000 IJ SOLN
INTRAMUSCULAR | Status: DC | PRN
  Administered 2017-09-15: 30 mL

## 2017-09-15 MED ORDER — PROPOFOL 500 MG/50ML IV EMUL
INTRAVENOUS | Status: DC | PRN
  Administered 2017-09-15: 100 ug/kg/min via INTRAVENOUS

## 2017-09-15 MED ORDER — ONDANSETRON HCL 4 MG PO TABS: 4.0000 mg | ORAL_TABLET | Freq: Four times a day (QID) | ORAL | Status: DC | PRN

## 2017-09-15 MED ORDER — CEFAZOLIN SODIUM-DEXTROSE 2-4 GM/100ML-% IV SOLN
2.0000 g | Freq: Four times a day (QID) | INTRAVENOUS | Status: AC
  Administered 2017-09-15 – 2017-09-16 (×2): 2 g via INTRAVENOUS
  Filled 2017-09-15 (×2): qty 100

## 2017-09-15 MED ORDER — OXYCODONE HCL ER 10 MG PO T12A
10.0000 mg | EXTENDED_RELEASE_TABLET | Freq: Two times a day (BID) | ORAL | Status: DC
  Administered 2017-09-16 (×2): 10 mg via ORAL
  Filled 2017-09-15 (×3): qty 1

## 2017-09-15 MED ORDER — HYDROMORPHONE HCL 1 MG/ML IJ SOLN
0.5000 mg | INTRAMUSCULAR | Status: DC | PRN
  Administered 2017-09-16: 2 mg via INTRAVENOUS
  Filled 2017-09-15: qty 2

## 2017-09-15 MED ORDER — ROPIVACAINE HCL 5 MG/ML IJ SOLN
INTRAMUSCULAR | Status: DC | PRN
  Administered 2017-09-15: 20 mL via PERINEURAL

## 2017-09-15 MED ORDER — KETOROLAC TROMETHAMINE 30 MG/ML IJ SOLN
INTRAMUSCULAR | Status: DC | PRN
  Administered 2017-09-15: 30 mg

## 2017-09-15 MED ORDER — PHENYLEPHRINE HCL 10 MG/ML IJ SOLN
INTRAMUSCULAR | Status: DC | PRN
  Administered 2017-09-15: 20 ug/min via INTRAVENOUS

## 2017-09-15 MED ORDER — HYDROMORPHONE HCL 1 MG/ML IJ SOLN: 0.2500 mg | INTRAMUSCULAR | Status: DC | PRN

## 2017-09-15 SURGICAL SUPPLY — 42 items
ALCOHOL ISOPROPYL (RUBBING) (MISCELLANEOUS) ×3 IMPLANT
BANDAGE ACE 6X5 VEL STRL LF (GAUZE/BANDAGES/DRESSINGS) ×3 IMPLANT
BLADE SAW RECIP 87.9 MT (BLADE) ×3 IMPLANT
BNDG ELASTIC 6X10 VLCR STRL LF (GAUZE/BANDAGES/DRESSINGS) IMPLANT
CAPT KNEE TRIATH TK-4 ×3 IMPLANT
CHLORAPREP W/TINT 26ML (MISCELLANEOUS) ×6 IMPLANT
CUFF TOURNIQUET SINGLE 34IN LL (TOURNIQUET CUFF) ×3 IMPLANT
DERMABOND ADVANCED (GAUZE/BANDAGES/DRESSINGS) ×2
DERMABOND ADVANCED .7 DNX12 (GAUZE/BANDAGES/DRESSINGS) ×1 IMPLANT
DRAIN HEMOVAC 7FR (DRAIN) IMPLANT
DRAPE EXTREMITY T 121X128X90 (DRAPE) ×3 IMPLANT
DRAPE U-SHAPE 47X51 STRL (DRAPES) ×3 IMPLANT
DRAPE UNIVERSAL PACK (DRAPES) ×3 IMPLANT
DRSG AQUACEL AG ADV 3.5X14 (GAUZE/BANDAGES/DRESSINGS) ×3 IMPLANT
ELECT REM PT RETURN 9FT ADLT (ELECTROSURGICAL) ×3
ELECTRODE REM PT RTRN 9FT ADLT (ELECTROSURGICAL) ×1 IMPLANT
EVACUATOR 1/8 PVC DRAIN (DRAIN) IMPLANT
GLOVE BIO SURGEON STRL SZ8.5 (GLOVE) ×9 IMPLANT
GLOVE BIOGEL PI IND STRL 8.5 (GLOVE) ×1 IMPLANT
GLOVE BIOGEL PI INDICATOR 8.5 (GLOVE) ×2
GOWN STRL REUS W/TWL 2XL LVL3 (GOWN DISPOSABLE) ×3 IMPLANT
HANDPIECE INTERPULSE COAX TIP (DISPOSABLE) ×2
HOOD PEEL AWAY FLYTE STAYCOOL (MISCELLANEOUS) ×6 IMPLANT
KIT BASIN OR (CUSTOM PROCEDURE TRAY) ×3 IMPLANT
MANIFOLD NEPTUNE II (INSTRUMENTS) ×3 IMPLANT
NEEDLE SPNL 18GX3.5 QUINCKE PK (NEEDLE) ×6 IMPLANT
PACK TOTAL JOINT (CUSTOM PROCEDURE TRAY) ×3 IMPLANT
PACK TOTAL KNEE CUSTOM (KITS) ×3 IMPLANT
SAW OSC TIP CART 19.5X105X1.3 (SAW) ×3 IMPLANT
SEALER BIPOLAR AQUA 6.0 (INSTRUMENTS) ×3 IMPLANT
SET HNDPC FAN SPRY TIP SCT (DISPOSABLE) ×1 IMPLANT
SET PAD KNEE POSITIONER (MISCELLANEOUS) ×3 IMPLANT
SUT MNCRL AB 3-0 PS2 27 (SUTURE) ×3 IMPLANT
SUT MON AB 2-0 CT1 36 (SUTURE) ×6 IMPLANT
SUT VIC AB 1 CTX 27 (SUTURE) ×6 IMPLANT
SUT VIC AB 2-0 CT1 27 (SUTURE)
SUT VIC AB 2-0 CT1 TAPERPNT 27 (SUTURE) IMPLANT
SUT VLOC 180 0 24IN GS25 (SUTURE) ×3 IMPLANT
SYR 50ML LL SCALE MARK (SYRINGE) ×6 IMPLANT
TOWER CARTRIDGE SMART MIX (DISPOSABLE) IMPLANT
TRAY CATH 16FR W/PLASTIC CATH (SET/KITS/TRAYS/PACK) ×3 IMPLANT
WRAP KNEE MAXI GEL POST OP (GAUZE/BANDAGES/DRESSINGS) ×3 IMPLANT

## 2017-09-15 NOTE — Transfer of Care (Signed)
Immediate Anesthesia Transfer of Care Note  Patient: Anthony Barnett  Procedure(s) Performed: LEFT TOTAL KNEE ARTHROPLASTY WITH COMPUTER NAVIGATION (Left Knee)  Patient Location: PACU  Anesthesia Type:MAC combined with regional for post-op pain  Level of Consciousness: awake, alert , oriented and patient cooperative  Airway & Oxygen Therapy: Patient Spontanous Breathing  Post-op Assessment: Report given to RN and Post -op Vital signs reviewed and stable  Post vital signs: Reviewed and stable  Last Vitals:  Vitals:   09/15/17 1600 09/15/17 2132  BP: (!) 161/96 111/73  Pulse: 74 79  Resp: 13 12  Temp:  36.7 C  SpO2: 99%     Last Pain:  Vitals:   09/15/17 1422  TempSrc:   PainSc: 7       Patients Stated Pain Goal: 3 (12/78/71 8367)  Complications: No apparent anesthesia complications

## 2017-09-15 NOTE — Anesthesia Procedure Notes (Signed)
Spinal  Patient location during procedure: OR Start time: 09/15/2017 6:45 PM End time: 09/15/2017 6:47 PM Staffing Anesthesiologist: Lillia Abed, MD Performed: anesthesiologist  Preanesthetic Checklist Completed: patient identified, surgical consent, pre-op evaluation, timeout performed, IV checked, risks and benefits discussed and monitors and equipment checked Spinal Block Patient position: sitting Prep: DuraPrep Patient monitoring: blood pressure, continuous pulse ox, cardiac monitor and heart rate Approach: right paramedian Location: L3-4 Injection technique: single-shot Needle Needle type: Pencan  Needle length: 9 cm Needle insertion depth: 6 cm

## 2017-09-15 NOTE — Anesthesia Preprocedure Evaluation (Addendum)
Anesthesia Evaluation  Patient identified by MRN, date of birth, ID band Patient awake    Reviewed: Allergy & Precautions, NPO status , Patient's Chart, lab work & pertinent test results  Airway Mallampati: II  TM Distance: >3 FB Neck ROM: Full    Dental no notable dental hx.    Pulmonary neg pulmonary ROS,    Pulmonary exam normal breath sounds clear to auscultation       Cardiovascular negative cardio ROS Normal cardiovascular exam Rhythm:Regular Rate:Normal     Neuro/Psych Anxiety Depression negative neurological ROS  negative psych ROS   GI/Hepatic negative GI ROS, Neg liver ROS,   Endo/Other  negative endocrine ROS  Renal/GU negative Renal ROS  negative genitourinary   Musculoskeletal negative musculoskeletal ROS (+) Arthritis , Osteoarthritis,    Abdominal   Peds negative pediatric ROS (+)  Hematology negative hematology ROS (+) Sickle cell anemia ,   Anesthesia Other Findings   Reproductive/Obstetrics negative OB ROS                            Anesthesia Physical Anesthesia Plan  ASA: II  Anesthesia Plan: Spinal   Post-op Pain Management:  Regional for Post-op pain   Induction: Intravenous  PONV Risk Score and Plan: 1 and Ondansetron and Treatment may vary due to age or medical condition  Airway Management Planned: Simple Face Mask  Additional Equipment:   Intra-op Plan:   Post-operative Plan:   Informed Consent: I have reviewed the patients History and Physical, chart, labs and discussed the procedure including the risks, benefits and alternatives for the proposed anesthesia with the patient or authorized representative who has indicated his/her understanding and acceptance.   Dental advisory given  Plan Discussed with: CRNA and Surgeon  Anesthesia Plan Comments:        Anesthesia Quick Evaluation

## 2017-09-15 NOTE — Anesthesia Procedure Notes (Signed)
Anesthesia Regional Block: Adductor canal block   Pre-Anesthetic Checklist: ,, timeout performed, Correct Patient, Correct Site, Correct Laterality, Correct Procedure, Correct Position, site marked, Risks and benefits discussed,  Surgical consent,  Pre-op evaluation,  At surgeon's request and post-op pain management  Laterality: Left  Prep: chloraprep       Needles:  Injection technique: Single-shot  Needle Type: Stimiplex     Needle Length: 9cm  Needle Gauge: 21     Additional Needles:   Procedures:,,,, ultrasound used (permanent image in chart),,,,  Narrative:  Start time: 09/15/2017 3:54 PM End time: 09/15/2017 3:59 PM Injection made incrementally with aspirations every 5 mL.  Performed by: Personally  Anesthesiologist: Lynda Rainwater, MD

## 2017-09-15 NOTE — Discharge Instructions (Signed)
° °Dr. Mohid Furuya °Total Joint Specialist °[facility] °[address] °[place], [place] [address] °[phone] ° °TOTAL KNEE REPLACEMENT POSTOPERATIVE DIRECTIONS ° ° ° °Knee Rehabilitation, Guidelines Following Surgery  °Results after knee surgery are often greatly improved when you follow the exercise, range of motion and muscle strengthening exercises prescribed by your doctor. Safety measures are also important to protect the knee from further injury. Any time any of these exercises cause you to have increased pain or swelling in your knee joint, decrease the amount until you are comfortable again and slowly increase them. If you have problems or questions, call your caregiver or physical therapist for advice.  ° °WEIGHT BEARING °Weight bearing as tolerated with assist device (walker, cane, etc) as directed, use it as long as suggested by your surgeon or therapist, typically at least 4-6 weeks. ° °HOME CARE INSTRUCTIONS  °Remove items at home which could result in a fall. This includes throw rugs or furniture in walking pathways.  °Continue medications as instructed at time of discharge. °You may have some home medications which will be placed on hold until you complete the course of blood thinner medication.  °You may start showering once you are discharged home but do not submerge the incision under water. Just pat the incision dry and apply a dry gauze dressing on daily. °Walk with walker as instructed.  °You may resume a sexual relationship in one month or when given the OK by your doctor.  °· Use walker as long as suggested by your caregivers. °· Avoid periods of inactivity such as sitting longer than an hour when not asleep. This helps prevent blood clots.  °You may put full weight on your legs and walk as much as is comfortable.  °You may return to work once you are cleared by your doctor.  °Do not drive a car for 6 weeks or until released by you surgeon.  °· Do not drive  while taking narcotics.  °Wear the elastic stockings for three weeks following surgery during the day but you may remove then at night. °Make sure you keep all of your appointments after your operation with all of your doctors and caregivers. You should call the office at the above phone number and make an appointment for approximately two weeks after the date of your surgery. °Do not remove your surgical dressing. The dressing is waterproof; you may take showers in 3 days, but do not take tub baths or submerge the dressing. °Please pick up a stool softener and laxative for home use as long as you are requiring pain medications. °· ICE to the affected knee every three hours for 30 minutes at a time and then as needed for pain and swelling.  Continue to use ice on the knee for pain and swelling from surgery. You may notice swelling that will progress down to the foot and ankle.  This is normal after surgery.  Elevate the leg when you are not up walking on it.   °It is important for you to complete the blood thinner medication as prescribed by your doctor. °· Continue to use the breathing machine which will help keep your temperature down.  It is common for your temperature to cycle up and down following surgery, especially at night when you are not up moving around and exerting yourself.  The breathing machine keeps your lungs expanded and your temperature down. ° °RANGE OF MOTION AND STRENGTHENING EXERCISES  °Rehabilitation of the knee is important following   a knee injury or an operation. After just a few days of immobilization, the muscles of the thigh which control the knee become weakened and shrink (atrophy). Knee exercises are designed to build up the tone and strength of the thigh muscles and to improve knee motion. Often times heat used for twenty to thirty minutes before working out will loosen up your tissues and help with improving the range of motion but do not use heat for the first two weeks following  surgery. These exercises can be done on a training (exercise) mat, on the floor, on a table or on a bed. Use what ever works the best and is most comfortable for you Knee exercises include:  °Leg Lifts - While your knee is still immobilized in a splint or cast, you can do straight leg raises. Lift the leg to 60 degrees, hold for 3 sec, and slowly lower the leg. Repeat 10-20 times 2-3 times daily. Perform this exercise against resistance later as your knee gets better.  °Quad and Hamstring Sets - Tighten up the muscle on the front of the thigh (Quad) and hold for 5-10 sec. Repeat this 10-20 times hourly. Hamstring sets are done by pushing the foot backward against an object and holding for 5-10 sec. Repeat as with quad sets.  °A rehabilitation program following serious knee injuries can speed recovery and prevent re-injury in the future due to weakened muscles. Contact your doctor or a physical therapist for more information on knee rehabilitation.  ° °SKILLED REHAB INSTRUCTIONS: °If the patient is transferred to a skilled rehab facility following release from the hospital, a list of the current medications will be sent to the facility for the patient to continue.  When discharged from the skilled rehab facility, please have the facility set up the patient's Home Health Physical Therapy prior to being released. Also, the skilled facility will be responsible for providing the patient with their medications at time of release from the facility to include their pain medication, the muscle relaxants, and their blood thinner medication. If the patient is still at the rehab facility at time of the two week follow up appointment, the skilled rehab facility will also need to assist the patient in arranging follow up appointment in our office and any transportation needs. ° °MAKE SURE YOU:  °Understand these instructions.  °Will watch your condition.  °Will get help right away if you are not doing well or get worse.   ° ° °Pick up stool softner and laxative for home use following surgery while on pain medications. °Do NOT remove your dressing. You may shower.  °Do not take tub baths or submerge incision under water. °May shower starting three days after surgery. °Please use a clean towel to pat the incision dry following showers. °Continue to use ice for pain and swelling after surgery. °Do not use any lotions or creams on the incision until instructed by your surgeon. ° °

## 2017-09-15 NOTE — Interval H&P Note (Signed)
History and Physical Interval Note:  09/15/2017 6:34 PM  Anthony Barnett  has presented today for surgery, with the diagnosis of Degenerative joint disease left knee  The various methods of treatment have been discussed with the patient and family. After consideration of risks, benefits and other options for treatment, the patient has consented to  Procedure(s) with comments: LEFT TOTAL KNEE ARTHROPLASTY WITH COMPUTER NAVIGATION (Left) - Needs RNFA as a surgical intervention .  The patient's history has been reviewed, patient examined, no change in status, stable for surgery.  I have reviewed the patient's chart and labs.  Questions were answered to the patient's satisfaction.     Hilton Cork Murel Shenberger

## 2017-09-15 NOTE — Anesthesia Postprocedure Evaluation (Signed)
Anesthesia Post Note  Patient: Anthony Barnett  Procedure(s) Performed: LEFT TOTAL KNEE ARTHROPLASTY WITH COMPUTER NAVIGATION (Left Knee)     Patient location during evaluation: PACU Anesthesia Type: Spinal Level of consciousness: oriented and awake and alert Pain management: pain level controlled Vital Signs Assessment: post-procedure vital signs reviewed and stable Respiratory status: spontaneous breathing, respiratory function stable and patient connected to nasal cannula oxygen Cardiovascular status: blood pressure returned to baseline and stable Postop Assessment: no headache, no backache and no apparent nausea or vomiting Anesthetic complications: no    Last Vitals:  Vitals:   09/15/17 2229 09/15/17 2230  BP: 112/86   Pulse: 70 71  Resp: 17 16  Temp: 36.6 C   SpO2: 95% 96%    Last Pain:  Vitals:   09/15/17 1422  TempSrc:   PainSc: 7                  Jordane Hisle DAVID

## 2017-09-15 NOTE — Op Note (Signed)
OPERATIVE REPORT  SURGEON: Rod Can, MD   ASSISTANT: Ky Barban, RNFA.  PREOPERATIVE DIAGNOSIS: Left knee arthritis.   POSTOPERATIVE DIAGNOSIS: Left knee arthritis.   PROCEDURE: Left total knee arthroplasty.   IMPLANTS: Stryker Triathlon CR femur, size 6. Stryker Tritanium tibia, size 6. X3 polyethelyene insert, size 11 mm, CR. 3 button asymmetric patella, size 40 mm.  ANESTHESIA:  Regional and Spinal  TOURNIQUET TIME: Not utilized.   ESTIMATED BLOOD LOSS:-300 mL    ANTIBIOTICS: 2 g Ancef.  DRAINS: None.  COMPLICATIONS: None   CONDITION: PACU - hemodynamically stable.   BRIEF CLINICAL NOTE: Anthony Barnett is a 61 y.o. male with a long-standing history of Left knee arthritis. After failing conservative management, the patient was indicated for total knee arthroplasty. The risks, benefits, and alternatives to the procedure were explained, and the patient elected to proceed.  PROCEDURE IN DETAIL: Adductor canal block was obtained in the pre-op holding area. Once inside the operative room, spinal anesthesia was obtained, and a foley catheter was inserted. The patient was then positioned, a nonsterile tourniquet was placed, and the lower extremity was prepped and draped in the normal sterile surgical fashion. A time-out was called verifying side and site of surgery. The patient received IV antibiotics within 60 minutes of beginning the procedure. The tourniquet was not utilized.  An anterior approach to the knee was performed utilizing a midvastus arthrotomy. A medial release was performed and the patellar fat pad was excised. Stryker navigation was used to cut the distal femur perpendicular to the mechanical axis. A freehand patellar resection was performed, and the patella was sized an prepared with 3 lug holes.  Nagivation was used to make a neutral proximal tibia resection,  taking 9 mm of bone from the less affected lateral side with 3 degrees of slope. The menisci were excised. A spacer block was placed, and the alignment and balance in extension were confirmed.   The distal femur was sized using the 3-degree external rotation guide referencing the posterior femoral cortex. The appropriate 4-in-1 cutting block was pinned into place. Rotation was checked using Whiteside's line, the epicondylar axis, and then confirmed with a spacer block in flexion. The remaining femoral cuts were performed, taking care to protect the MCL.  The tibia was sized and the trial tray was pinned into place. The remaining trail components were inserted. The knee was stable to varus and valgus stress through a full range of motion. The patella tracked centrally, and the PCL was well balanced. The trial components were removed, and the proximal tibial surface was prepared. Final components were impacted into place. The knee was tested for a final time and found to be well balanced.  The wound was copiously irrigated with normal saline with pulse lavage. Marcaine solution was injected into the periarticular soft tissue. The wound was closed in layers using #1 Vicryl and Stratafix for the fascia, 2-0 Vicryl for the subcutaneous fat, 2-0 Monocryl for the deep dermal layer, 3-0 running Monocryl subcuticular Stitch, and Dermabond for the skin. Once the glue was fully dried, an Aquacell Ag and compressive dressing were applied. Tthe patient was transported to the recovery room in stable condition. Sponge, needle, and instrument counts were correct at the end of the case x2. The patient tolerated the procedure well and there were no known complications.

## 2017-09-16 ENCOUNTER — Other Ambulatory Visit: Payer: Self-pay

## 2017-09-16 ENCOUNTER — Encounter (HOSPITAL_COMMUNITY): Payer: Self-pay | Admitting: Orthopedic Surgery

## 2017-09-16 LAB — GLUCOSE, CAPILLARY
GLUCOSE-CAPILLARY: 123 mg/dL — AB (ref 65–99)
Glucose-Capillary: 118 mg/dL — ABNORMAL HIGH (ref 65–99)

## 2017-09-16 LAB — CBC
HCT: 36.6 % — ABNORMAL LOW (ref 39.0–52.0)
Hemoglobin: 12.1 g/dL — ABNORMAL LOW (ref 13.0–17.0)
MCH: 30.5 pg (ref 26.0–34.0)
MCHC: 33.1 g/dL (ref 30.0–36.0)
MCV: 92.2 fL (ref 78.0–100.0)
Platelets: 128 10*3/uL — ABNORMAL LOW (ref 150–400)
RBC: 3.97 MIL/uL — ABNORMAL LOW (ref 4.22–5.81)
RDW: 13.4 % (ref 11.5–15.5)
WBC: 11 10*3/uL — ABNORMAL HIGH (ref 4.0–10.5)

## 2017-09-16 LAB — BASIC METABOLIC PANEL
Anion gap: 5 (ref 5–15)
BUN: 13 mg/dL (ref 6–20)
CALCIUM: 8.1 mg/dL — AB (ref 8.9–10.3)
CHLORIDE: 106 mmol/L (ref 101–111)
CO2: 27 mmol/L (ref 22–32)
CREATININE: 0.72 mg/dL (ref 0.61–1.24)
GFR calc non Af Amer: >60 mL/min (ref >60)
Glucose, Bld: 97 mg/dL (ref 65–99)
Potassium: 4 mmol/L (ref 3.5–5.1)
Sodium: 138 mmol/L (ref 135–145)

## 2017-09-16 MED ORDER — ASPIRIN 81 MG PO CHEW: 81.0000 mg | CHEWABLE_TABLET | Freq: Two times a day (BID) | ORAL | 1 refills | Status: DC

## 2017-09-16 MED ORDER — ONDANSETRON HCL 4 MG PO TABS: 4.0000 mg | ORAL_TABLET | Freq: Four times a day (QID) | ORAL | 0 refills | Status: DC | PRN

## 2017-09-16 MED ORDER — OXYCODONE HCL ER 10 MG PO T12A: 10.0000 mg | EXTENDED_RELEASE_TABLET | ORAL | 0 refills | Status: DC

## 2017-09-16 MED ORDER — HYDROCODONE-ACETAMINOPHEN 5-325 MG PO TABS: 1.0000 | ORAL_TABLET | Freq: Four times a day (QID) | ORAL | 0 refills | Status: DC | PRN

## 2017-09-16 MED ORDER — SENNA 8.6 MG PO TABS: 2.0000 | ORAL_TABLET | Freq: Every day | ORAL | 0 refills | Status: DC

## 2017-09-16 MED ORDER — DOCUSATE SODIUM 100 MG PO CAPS: 100.0000 mg | ORAL_CAPSULE | Freq: Two times a day (BID) | ORAL | 1 refills | Status: DC

## 2017-09-16 NOTE — Progress Notes (Signed)
Physical Therapy Treatment Patient Details Name: Cylus Douville MRN: [id number] DOB: 1956/10/10 Today's Date: 09/16/2017    History of Present Illness L TKA, h/o L TSA February 2018, back surgery    PT Comments    Pt has met PT goals and is ready to DC home from PT standpoint. He ambulated 200' with RW, no loss of balance. Stair training completed. Reviewed HEP with pt/wife, they demonstrated good understanding.     Follow Up Recommendations  Outpatient PT     Equipment Recommendations  None recommended by PT    Recommendations for Other Services       Precautions / Restrictions Precautions Precautions: Fall;Knee Precaution Comments: reviewed no pillow under knee Restrictions Weight Bearing Restrictions: No    Mobility  Bed Mobility Overal bed mobility: Modified Independent             General bed mobility comments: HOB up  Transfers Overall transfer level: Needs assistance Equipment used: Rolling walker (2 wheeled) Transfers: Sit to/from Stand Sit to Stand: Supervision         General transfer comment: VCs hand placement  Ambulation/Gait Ambulation/Gait assistance: Supervision Ambulation Distance (Feet): 200 Feet Assistive device: Rolling walker (2 wheeled) Gait Pattern/deviations: Step-to pattern;Antalgic   Gait velocity interpretation: Below normal speed for age/gender General Gait Details: good sequencing, no LOB   Stairs Stairs: Yes   Stair Management: No rails;Backwards;Step to pattern;With walker Number of Stairs: 1 General stair comments: wife present, VCs sequencing  Wheelchair Mobility    Modified Rankin (Stroke Patients Only)       Balance Overall balance assessment: Modified Independent                                          Cognition Arousal/Alertness: Awake/alert Behavior During Therapy: WFL for tasks assessed/performed Overall Cognitive Status: Within Functional Limits for tasks assessed                                         Exercises Total Joint Exercises Ankle Circles/Pumps: AROM;10 reps Quad Sets: AROM;Both;5 reps;Supine Short Arc Quad: AROM;Left;10 reps Heel Slides: AAROM;Left;10 reps Hip ABduction/ADduction: AAROM;Left;10 reps;Supine Straight Leg Raises: AROM;Left;10 reps;Supine Long Arc Quad: AROM;10 reps;Left;Seated Knee Flexion: AAROM;Left;5 reps;Seated Goniometric ROM: 5-70* AAROM L knee    General Comments        Pertinent Vitals/Pain Pain Assessment: Faces Pain Score: 7  Pain Location: L knee Pain Descriptors / Indicators: Sore Pain Intervention(s): Limited activity within patient's tolerance;Premedicated before session;Monitored during session;Ice applied    Home Living Family/patient expects to be discharged to:: Private residence Living Arrangements: Spouse/significant other Available Help at Discharge: Family;Available 24 hours/day Type of Home: House Home Access: Stairs to enter   Home Layout: One level Home Equipment: Environmental consultant - 2 wheels;Shower seat - built in;Bedside commode;Shower seat      Prior Function Level of Independence: Independent          PT Goals (current goals can now be found in the care plan section) Acute Rehab PT Goals Patient Stated Goal: golf, play with grandkids PT Goal Formulation: With patient/family Time For Goal Achievement: 09/23/17 Potential to Achieve Goals: Good Progress towards PT goals: Progressing toward goals    Frequency    7X/week      PT Plan Current plan remains appropriate  Co-evaluation              AM-PAC PT "6 Clicks" Daily Activity  Outcome Measure  Difficulty turning over in bed (including adjusting bedclothes, sheets and blankets)?: None Difficulty moving from lying on back to sitting on the side of the bed? : None Difficulty sitting down on and standing up from a chair with arms (e.g., wheelchair, bedside commode, etc,.)?: None Help needed moving to and from a  bed to chair (including a wheelchair)?: None Help needed walking in hospital room?: A Little Help needed climbing 3-5 steps with a railing? : A Little 6 Click Score: 22    End of Session Equipment Utilized During Treatment: Gait belt Activity Tolerance: Patient tolerated treatment well Patient left: with family/visitor present;with call bell/phone within reach;in bed Nurse Communication: Mobility status PT Visit Diagnosis: Difficulty in walking, not elsewhere classified (R26.2);Pain Pain - Right/Left: Left Pain - part of body: Knee     Time: 4473-9584 PT Time Calculation (min) (ACUTE ONLY): 32 min  Charges:  $Gait Training: 8-22 mins $Therapeutic Exercise: 8-22 mins                    G Codes:          Philomena Doheny 09/16/2017, 1:58 PM [phone]

## 2017-09-16 NOTE — Care Management Note (Signed)
Case Management Note  Patient Details  Name: Anthony Barnett MRN: [id number] Date of Birth: 1955-10-23  Subjective/Objective:                    Action/Plan:  Await PT eval and MD orders. Patient per assigned to [facility]  at [facility] Expected Discharge Date:  09/16/17               Expected Discharge Plan:  [facility]  In-House Referral:     Discharge planning Services  CM Consult  Post Acute Care Choice:  Durable Medical Equipment Choice offered to:     DME Arranged:    DME Agency:     HH Arranged:    [place] Agency:     Status of Service:  In process, will continue to follow  If discussed at Long Length of Stay Meetings, dates discussed:    Additional Comments:  Marilu Favre, RN 09/16/2017, 10:33 AM

## 2017-09-16 NOTE — Evaluation (Signed)
Physical Therapy Evaluation Patient Details Name: Anthony Barnett MRN: [id number] DOB: 11/03/1955 Today's Date: 09/16/2017   History of Present Illness  L TKA, h/o L TSA, back surgery  Clinical Impression  Pt is s/p TKA resulting in the deficits listed below (see PT Problem List). Pt ambulated 120' with RW, no loss of balance. Instructed pt in HEP. Will plan to do stair training this afternoon, then expect he will be ready to DC home from PT standpoint.  Pt will benefit from skilled PT to increase their independence and safety with mobility to allow discharge to the venue listed below.      Follow Up Recommendations Outpatient PT    Equipment Recommendations  None recommended by PT    Recommendations for Other Services       Precautions / Restrictions Precautions Precautions: Fall;Knee Precaution Comments: reviewed no pillow under knee Restrictions Weight Bearing Restrictions: No      Mobility  Bed Mobility Overal bed mobility: Modified Independent             General bed mobility comments: HOB up  Transfers Overall transfer level: Needs assistance Equipment used: Rolling walker (2 wheeled) Transfers: Sit to/from Stand Sit to Stand: Min guard         General transfer comment: VCs hand placement  Ambulation/Gait Ambulation/Gait assistance: Min guard Ambulation Distance (Feet): 120 Feet Assistive device: Rolling walker (2 wheeled) Gait Pattern/deviations: Step-to pattern;Antalgic   Gait velocity interpretation: Below normal speed for age/gender General Gait Details: VCs sequencing, no LOB  Stairs            Wheelchair Mobility    Modified Rankin (Stroke Patients Only)       Balance Overall balance assessment: Modified Independent                                           Pertinent Vitals/Pain Pain Assessment: 0-10 Pain Score: 7  Pain Location: L knee Pain Descriptors / Indicators: Sore Pain Intervention(s): Limited  activity within patient's tolerance;Monitored during session;Premedicated before session;Ice applied    Home Living Family/patient expects to be discharged to:: Private residence Living Arrangements: Spouse/significant other Available Help at Discharge: Family;Available 24 hours/day Type of Home: House Home Access: Stairs to enter   [employer] of Steps: 1 Home Layout: One level Home Equipment: Walker - 2 wheels;Shower seat - built in;Bedside commode      Prior Function Level of Independence: Independent               Hand Dominance        Extremity/Trunk Assessment   Upper Extremity Assessment Upper Extremity Assessment: Defer to OT evaluation    Lower Extremity Assessment Lower Extremity Assessment: LLE deficits/detail LLE Deficits / Details: SLR 3/5, knee ext -3/5, AAROM L knee 10-70*    Cervical / Trunk Assessment Cervical / Trunk Assessment: Normal  Communication      Cognition Arousal/Alertness: Awake/alert Behavior During Therapy: WFL for tasks assessed/performed Overall Cognitive Status: Within Functional Limits for tasks assessed                                        General Comments      Exercises Total Joint Exercises Ankle Circles/Pumps: AROM;10 reps Quad Sets: AROM;Both;5 reps;Supine Short Arc Quad: AROM;Left;10 reps Heel Slides: AAROM;Left;10 reps  Hip ABduction/ADduction: AAROM;Left;10 reps;Supine Straight Leg Raises: AROM;Left;10 reps;Supine Long Arc Quad: AROM;10 reps;Left;Seated Knee Flexion: AAROM;Left;5 reps;Seated Goniometric ROM: 10-70* AAROM L knee   Assessment/Plan    PT Assessment Patient needs continued PT services  PT Problem List Decreased strength;Decreased range of motion;Decreased mobility;Pain       PT Treatment Interventions Gait training;DME instruction;Therapeutic activities;Stair training;Therapeutic exercise;Functional mobility training;Patient/family education    PT Goals (Current  goals Barnett be found in the Care Plan section)  Acute Rehab PT Goals Patient Stated Goal: golf, play with grandkids PT Goal Formulation: With patient/family Time For Goal Achievement: 09/23/17 Potential to Achieve Goals: Good    Frequency 7X/week   Barriers to discharge        Co-evaluation               AM-PAC PT "6 Clicks" Daily Activity  Outcome Measure Difficulty turning over in bed (including adjusting bedclothes, sheets and blankets)?: A Little Difficulty moving from lying on back to sitting on the side of the bed? : A Little Difficulty sitting down on and standing up from a chair with arms (e.g., wheelchair, bedside commode, etc,.)?: A Little Help needed moving to and from a bed to chair (including a wheelchair)?: A Little Help needed walking in hospital room?: A Little Help needed climbing 3-5 steps with a railing? : A Lot 6 Click Score: 17    End of Session Equipment Utilized During Treatment: Gait belt Activity Tolerance: Patient tolerated treatment well Patient left: in chair;with family/visitor present;with call bell/phone within reach Nurse Communication: Mobility status PT Visit Diagnosis: Difficulty in walking, not elsewhere classified (R26.2);Pain Pain - Right/Left: Left Pain - part of body: Knee    Time: 1015-1050 PT Time Calculation (min) (ACUTE ONLY): 35 min   Charges:   PT Evaluation $PT Eval Low Complexity: 1 Low PT Treatments $Gait Training: 8-22 mins   PT G Codes:          Philomena Doheny 09/16/2017, 11:06 AM [phone]

## 2017-09-16 NOTE — Evaluation (Signed)
Occupational Therapy Evaluation and Discharge Patient Details Name: Anthony Barnett MRN: [id number] DOB: 1956/04/25 Today's Date: 09/16/2017    History of Present Illness L TKA, h/o L TSA, back surgery   Clinical Impression   All education completed as detailed below. Pt has all necessary DME and 24 hour care at home. No further OT needs.    Follow Up Recommendations  No OT follow up    Equipment Recommendations  None recommended by OT    Recommendations for Other Services       Precautions / Restrictions Precautions Precautions: Fall;Knee Precaution Comments: reviewed no pillow under knee Restrictions Weight Bearing Restrictions: No      Mobility Bed Mobility Pt receive in chair  Transfers Overall transfer level: Needs assistance Equipment used: Rolling walker (2 wheeled) Transfers: Sit to/from Stand Sit to Stand: Supervision         General transfer comment: VCs hand placement    Balance Overall balance assessment: Modified Independent                                         ADL either performed or assessed with clinical judgement   ADL Overall ADL's : Needs assistance/impaired Eating/Feeding: Independent;Sitting   Grooming: Standing;Supervision/safety   Upper Body Bathing: Set up;Sitting   Lower Body Bathing: Minimal assistance;Sit to/from stand Lower Body Bathing Details (indicate cue type and reason): recommended long handled bath sponge Upper Body Dressing : Set up;Sitting   Lower Body Dressing: Minimal assistance;Sit to/from stand Lower Body Dressing Details (indicate cue type and reason): instructed in compensatory strategies Toilet Transfer: Supervision/safety;RW;Ambulation         Tub/Shower Transfer Details (indicate cue type and reason): educate in technique with walker Functional mobility during ADLs: Supervision/safety;Rolling walker General ADL Comments: Educated in safe footwear and how to transport items safely  with RW.     Vision Patient Visual Report: No change from baseline       Perception     Praxis      Pertinent Vitals/Pain Pain Assessment: Faces Pain Score: 7  Pain Location: L knee Pain Descriptors / Indicators: Sore Pain Intervention(s): Premedicated before session;Repositioned;Ice applied     Hand Dominance Right   Extremity/Trunk Assessment Upper Extremity Assessment Upper Extremity Assessment: RUE deficits/detail;LUE deficits/detail RUE Deficits / Details: reports "pinched nerve" in shoulder, no limitations LUE Deficits / Details: h/o TSA in February 2018, no limitations   Lower Extremity Assessment Lower Extremity Assessment: Defer to PT evaluation LLE Deficits / Details: SLR 3/5, knee ext -3/5, AAROM L knee 10-70*   Cervical / Trunk Assessment Cervical / Trunk Assessment: Normal   Communication Communication Communication: No difficulties   Cognition Arousal/Alertness: Awake/alert Behavior During Therapy: WFL for tasks assessed/performed Overall Cognitive Status: Within Functional Limits for tasks assessed                                     General Comments       Exercises Total Joint Exercises Ankle Circles/Pumps: AROM;10 reps Quad Sets: AROM;Both;5 reps;Supine Short Arc Quad: AROM;Left;10 reps Heel Slides: AAROM;Left;10 reps Hip ABduction/ADduction: AAROM;Left;10 reps;Supine Straight Leg Raises: AROM;Left;10 reps;Supine Long Arc Quad: AROM;10 reps;Left;Seated Knee Flexion: AAROM;Left;5 reps;Seated Goniometric ROM: 10-70* AAROM L knee   Shoulder Instructions      Home Living Family/patient expects to be discharged to:: Private residence Living  Arrangements: Spouse/significant other Available Help at Discharge: Family;Available 24 hours/day Type of Home: House Home Access: Stairs to enter [employer] of Steps: 1   Home Layout: One level     Bathroom Shower/Tub: Occupational psychologist: Handicapped  height     Home Equipment: Environmental consultant - 2 wheels;Shower seat - built in;Bedside commode;Shower seat          Prior Functioning/Environment Level of Independence: Independent                 OT Problem List:        OT Treatment/Interventions:      OT Goals(Current goals can be found in the care plan section) Acute Rehab OT Goals Patient Stated Goal: golf, play with grandkids  OT Frequency:     Barriers to D/C:            Co-evaluation              AM-PAC PT "6 Clicks" Daily Activity     Outcome Measure Help from another person eating meals?: None Help from another person taking care of personal grooming?: A Little Help from another person toileting, which includes using toliet, bedpan, or urinal?: A Little Help from another person bathing (including washing, rinsing, drying)?: A Little Help from another person to put on and taking off regular upper body clothing?: None Help from another person to put on and taking off regular lower body clothing?: A Little 6 Click Score: 20   End of Session Equipment Utilized During Treatment: Rolling walker  Activity Tolerance: Patient tolerated treatment well Patient left: in chair;with call bell/phone within reach;with family/visitor present  OT Visit Diagnosis: Unsteadiness on feet (R26.81);Pain                Time: 9450-3888 OT Time Calculation (min): 15 min Charges:  OT General Charges $OT Visit: 1 Visit OT Evaluation $OT Eval Low Complexity: 1 Low G-Codes:     Malka So 09/16/2017, 11:29 AM  09/16/2017 Nestor Lewandowsky, OTR/L Pager: [phone]

## 2017-09-16 NOTE — Discharge Summary (Signed)
Physician Discharge Summary  Patient ID: Anthony Barnett MRN: [id number] DOB/AGE: 23-Apr-1956 61 y.o.  Admit date: 09/15/2017 Discharge date: 09/16/2017  Admission Diagnoses:  Osteoarthritis of left knee  Discharge Diagnoses:  Principal Problem:   Osteoarthritis of left knee   Past Medical History:  Diagnosis Date  . Arthritis of knee, left   . DDD (degenerative disc disease), lumbar   . Dysthymia   . Epidermoid cyst of skin   . GAD (generalized anxiety disorder)   . Hx of adenomatous polyp of colon 2008; 2013   Recall 05/2017 (Dr. Hervey Ard, [place], [place].  . Insomnia secondary to anxiety    stress mainly related to work    Surgeries: Procedure(s): LEFT TOTAL KNEE ARTHROPLASTY WITH COMPUTER NAVIGATION on 09/15/2017   Consultants (if any):   Discharged Condition: Improved  Hospital Course: Anthony Barnett is an 61 y.o. male who was admitted 09/15/2017 with a diagnosis of Osteoarthritis of left knee and went to the operating room on 09/15/2017 and underwent the above named procedures.    He was given perioperative antibiotics:  Anti-infectives (From admission, onward)   Start     Dose/Rate Route Frequency Ordered Stop   09/16/17 0000  ceFAZolin (ANCEF) IVPB 2g/100 mL premix     2 g 200 mL/hr over 30 Minutes Intravenous Every 6 hours 09/15/17 2138 09/16/17 0740   09/15/17 1515  ceFAZolin (ANCEF) IVPB 2g/100 mL premix     2 g 200 mL/hr over 30 Minutes Intravenous To [facility] 09/12/17 0901 09/15/17 1849    .  He was given sequential compression devices, early ambulation, and ASA for DVT prophylaxis.  He benefited maximally from the hospital stay and there were no complications.    Recent vital signs:  Vitals:   09/16/17 0042 09/16/17 0446  BP: 121/79 135/83  Pulse: 74 76  Resp: 18 18  Temp: 98.4 F (36.9 C) 98.5 F (36.9 C)  SpO2: 98% 98%    Recent laboratory studies:  Lab Results  Component Value Date   HGB 12.1  (L) 09/16/2017   HGB 14.6 09/05/2017   HGB 13.1 06/05/2017   Lab Results  Component Value Date   WBC 11.0 (H) 09/16/2017   PLT 128 (L) 09/16/2017   No results found for: INR Lab Results  Component Value Date   NA 138 09/16/2017   K 4.0 09/16/2017   CL 106 09/16/2017   CO2 27 09/16/2017   BUN 13 09/16/2017   CREATININE 0.72 09/16/2017   GLUCOSE 97 09/16/2017    Discharge Medications:   Allergies as of 09/16/2017   No Known Allergies     Medication List    STOP taking these medications   acetaminophen 325 MG tablet Commonly known as:  TYLENOL     TAKE these medications   aspirin 81 MG chewable tablet Chew 1 tablet (81 mg total) by mouth 2 (two) times daily.   docusate sodium 100 MG capsule Commonly known as:  COLACE Take 1 capsule (100 mg total) by mouth 2 (two) times daily.   HYDROcodone-acetaminophen 5-325 MG tablet Commonly known as:  NORCO/VICODIN Take 1-2 tablets by mouth every 6 (six) hours as needed (breakthrough knee pain).   ibuprofen 200 MG tablet Commonly known as:  ADVIL,MOTRIN Take 400 mg every 8 (eight) hours as needed by mouth for mild pain (takes at least once every day).   ondansetron 4 MG tablet Commonly known as:  ZOFRAN Take 1 tablet (4 mg total) by mouth every 6 (six) hours as  needed for nausea.   oxyCODONE 10 mg 12 hr tablet Commonly known as:  OXYCONTIN Take 1 tablet (10 mg total) by mouth PRO. 1 tab PO every 12 hours for 3 days, then 1 tab PO daily for 4 days   senna 8.6 MG Tabs tablet Commonly known as:  SENOKOT Take 2 tablets (17.2 mg total) by mouth at bedtime.            Durable Medical Equipment  (From admission, onward)        Start     Ordered   09/15/17 2249  DME Walker rolling  Once    Question:  Patient needs a walker to treat with the following condition  Answer:  S/P total knee replacement, left   09/15/17 2249      Diagnostic Studies: Dg Knee Left Port  Result Date: 09/15/2017 CLINICAL DATA:  Status  post left knee arthroplasty EXAM: PORTABLE LEFT KNEE - 1-2 VIEW COMPARISON:  None. FINDINGS: Status post left knee replacement with normal alignment. No fracture. Gas in the soft tissues and joint space. Small osteochondroma distal medial femoral cortex IMPRESSION: 1. Status post left knee replacement with expected postsurgical change 2. Small distal femoral osteochondroma Electronically Signed   By: Donavan Foil M.D.   On: 09/15/2017 22:25    Disposition: Final discharge disposition not confirmed  Discharge Instructions    Call MD / Call 911   Complete by:  As directed    If you experience chest pain or shortness of breath, CALL 911 and be transported to the hospital emergency room.  If you develope a fever above 101 F, pus (white drainage) or increased drainage or redness at the wound, or calf pain, call your surgeon's office.   Constipation Prevention   Complete by:  As directed    Drink plenty of fluids.  Prune juice may be helpful.  You may use a stool softener, such as Colace (over the counter) 100 mg twice a day.  Use MiraLax (over the counter) for constipation as needed.   Diet - low sodium heart healthy   Complete by:  As directed    Do not put a pillow under the knee. Place it under the heel.   Complete by:  As directed    Driving restrictions   Complete by:  As directed    No driving for 6 weeks   Increase activity slowly as tolerated   Complete by:  As directed    Lifting restrictions   Complete by:  As directed    No lifting for 6 weeks   TED hose   Complete by:  As directed    Use stockings (TED hose) for 2 weeks on both leg(s).  You may remove them at night for sleeping.      Follow-up Information    Telly Jawad, Aaron Edelman, MD. Schedule an appointment as soon as possible for a visit in 2 weeks.   Specialty:  Orthopedic Surgery Why:  For wound re-check Contact information: [address] [place] [address] [phone]            Signed: Hilton Cork  Emberlyn Burlison 09/16/2017, 7:55 AM

## 2017-09-16 NOTE — Progress Notes (Signed)
Pt discharged to home with wife.  Discharge instructions and prescriptions given.

## 2017-09-16 NOTE — Progress Notes (Addendum)
Pt. Didn't urinate since he arrieved to the floor from  OR.I He was refusing to scanned his abd and finally he agrees to be scanned.724ml urine in his bladder and refused to relief him  by in and out catheter,.

## 2017-09-16 NOTE — Care Management Note (Signed)
Case Management Note  Patient Details  Name: Anthony Barnett MRN: [id number] Date of Birth: 03-03-1956  Subjective/Objective:                    Action/Plan:  Spoke to patient at bedside . Patient already has outpatient PT appointment at [facility]( confirmed appointment). Patient already has walker and bedside commode at home. Expected Discharge Date:  09/16/17               Expected Discharge Plan:  OP Rehab  In-House Referral:     Discharge planning Services  CM Consult  Post Acute Care Choice:  NA Choice offered to:  Patient, Spouse  DME Arranged:    DME Agency:     HH Arranged:    [place] Agency:     Status of Service:  Completed, signed off  If discussed at [place] of Stay Meetings, dates discussed:    Additional Comments:  Marilu Favre, RN 09/16/2017, 1:30 PM

## 2017-09-16 NOTE — Progress Notes (Signed)
   Subjective:  Patient reports pain as mild to moderate.  Denies N/V/CP/SOB.  Objective:   VITALS:   Vitals:   09/15/17 2250 09/15/17 2301 09/16/17 0042 09/16/17 0446  BP: 113/77 113/70 121/79 135/83  Pulse: 70 70 74 76  Resp: 16 18 18 18   Temp: 98.3 F (36.8 C) 98.3 F (36.8 C) 98.4 F (36.9 C) 98.5 F (36.9 C)  TempSrc: Oral Oral Oral Oral  SpO2: 97% 97% 98% 98%  Weight:      Height:       NAD ABD soft Sensation intact distally Intact pulses distally Dorsiflexion/Plantar flexion intact Incision: dressing C/D/I Compartment soft   Lab Results  Component Value Date   WBC 11.0 (H) 09/16/2017   HGB 12.1 (L) 09/16/2017   HCT 36.6 (L) 09/16/2017   MCV 92.2 09/16/2017   PLT 128 (L) 09/16/2017   BMET    Component Value Date/Time   NA 138 09/16/2017 0544   NA 145 02/13/2012   K 4.0 09/16/2017 0544   CL 106 09/16/2017 0544   CO2 27 09/16/2017 0544   GLUCOSE 97 09/16/2017 0544   BUN 13 09/16/2017 0544   BUN 14 02/13/2012   CREATININE 0.72 09/16/2017 0544   CALCIUM 8.1 (L) 09/16/2017 0544   GFRNONAA >60 09/16/2017 0544   GFRAA >60 09/16/2017 0544     Assessment/Plan: 1 Day Post-Op   Principal Problem:   Osteoarthritis of left knee   WBAT with walker DVT ppx: ASA, SCds, TEDs PO pain control PT/OT Patient hasn't voided since arriving to floor, plan for bladder scan, possible I&O cath, will have to spontaneously void prior to d/c Dispo: d/c home after clears therapy & voids, today vs tomorrow   Anthony Barnett 09/16/2017, 7:49 AM   Rod Can, MD Cell [phone]

## 2017-09-18 ENCOUNTER — Encounter: Payer: Self-pay | Admitting: Family Medicine

## 2017-09-19 DIAGNOSIS — M25561 Pain in right knee: Secondary | ICD-10-CM | POA: Diagnosis not present

## 2017-09-22 DIAGNOSIS — M25562 Pain in left knee: Secondary | ICD-10-CM | POA: Diagnosis not present

## 2017-09-24 DIAGNOSIS — M25562 Pain in left knee: Secondary | ICD-10-CM | POA: Diagnosis not present

## 2017-09-25 NOTE — Telephone Encounter (Signed)
Patient did not return calls or send pathology reports. Previous colon report in records file.

## 2017-09-26 DIAGNOSIS — M25562 Pain in left knee: Secondary | ICD-10-CM | POA: Diagnosis not present

## 2017-10-03 DIAGNOSIS — Z96652 Presence of left artificial knee joint: Secondary | ICD-10-CM | POA: Diagnosis not present

## 2017-10-03 DIAGNOSIS — Z471 Aftercare following joint replacement surgery: Secondary | ICD-10-CM | POA: Diagnosis not present

## 2017-10-03 DIAGNOSIS — M25562 Pain in left knee: Secondary | ICD-10-CM | POA: Diagnosis not present

## 2017-10-06 DIAGNOSIS — M25562 Pain in left knee: Secondary | ICD-10-CM | POA: Diagnosis not present

## 2017-10-08 DIAGNOSIS — M25562 Pain in left knee: Secondary | ICD-10-CM | POA: Diagnosis not present

## 2017-10-10 DIAGNOSIS — M25562 Pain in left knee: Secondary | ICD-10-CM | POA: Diagnosis not present

## 2017-10-15 DIAGNOSIS — M25562 Pain in left knee: Secondary | ICD-10-CM | POA: Diagnosis not present

## 2017-10-17 DIAGNOSIS — M25562 Pain in left knee: Secondary | ICD-10-CM | POA: Diagnosis not present

## 2017-10-21 DIAGNOSIS — K4021 Bilateral inguinal hernia, without obstruction or gangrene, recurrent: Secondary | ICD-10-CM

## 2017-10-21 DIAGNOSIS — M4802 Spinal stenosis, cervical region: Secondary | ICD-10-CM

## 2017-10-21 HISTORY — DX: Spinal stenosis, cervical region: M48.02

## 2017-10-21 HISTORY — DX: Bilateral inguinal hernia, without obstruction or gangrene, recurrent: K40.21

## 2017-10-22 DIAGNOSIS — M25562 Pain in left knee: Secondary | ICD-10-CM | POA: Diagnosis not present

## 2017-10-24 DIAGNOSIS — M25562 Pain in left knee: Secondary | ICD-10-CM | POA: Diagnosis not present

## 2017-10-28 DIAGNOSIS — M25562 Pain in left knee: Secondary | ICD-10-CM | POA: Diagnosis not present

## 2017-10-31 DIAGNOSIS — Z96652 Presence of left artificial knee joint: Secondary | ICD-10-CM | POA: Diagnosis not present

## 2017-10-31 DIAGNOSIS — Z471 Aftercare following joint replacement surgery: Secondary | ICD-10-CM | POA: Diagnosis not present

## 2017-10-31 DIAGNOSIS — M25562 Pain in left knee: Secondary | ICD-10-CM | POA: Diagnosis not present

## 2017-11-03 ENCOUNTER — Ambulatory Visit: Payer: BLUE CROSS/BLUE SHIELD | Admitting: Family Medicine

## 2017-11-04 ENCOUNTER — Encounter: Payer: Self-pay | Admitting: Family Medicine

## 2017-11-04 ENCOUNTER — Ambulatory Visit (INDEPENDENT_AMBULATORY_CARE_PROVIDER_SITE_OTHER): Payer: BLUE CROSS/BLUE SHIELD | Admitting: Family Medicine

## 2017-11-04 VITALS — BP 137/88 | HR 88 | Temp 98.3°F | Resp 16 | Wt 242.0 lb

## 2017-11-04 DIAGNOSIS — Z9889 Other specified postprocedural states: Secondary | ICD-10-CM | POA: Diagnosis not present

## 2017-11-04 DIAGNOSIS — R1032 Left lower quadrant pain: Secondary | ICD-10-CM

## 2017-11-04 DIAGNOSIS — N401 Enlarged prostate with lower urinary tract symptoms: Secondary | ICD-10-CM

## 2017-11-04 DIAGNOSIS — Z8719 Personal history of other diseases of the digestive system: Secondary | ICD-10-CM

## 2017-11-04 DIAGNOSIS — N138 Other obstructive and reflux uropathy: Secondary | ICD-10-CM

## 2017-11-04 DIAGNOSIS — N410 Acute prostatitis: Secondary | ICD-10-CM | POA: Diagnosis not present

## 2017-11-04 DIAGNOSIS — R1030 Lower abdominal pain, unspecified: Secondary | ICD-10-CM | POA: Diagnosis not present

## 2017-11-04 DIAGNOSIS — R3915 Urgency of urination: Secondary | ICD-10-CM

## 2017-11-04 LAB — POCT URINALYSIS DIPSTICK
BILIRUBIN UA: NEGATIVE
Blood, UA: NEGATIVE
Glucose, UA: NEGATIVE
Leukocytes, UA: NEGATIVE
Nitrite, UA: NEGATIVE
PROTEIN UA: NEGATIVE
Spec Grav, UA: >=1.03 — AB (ref 1.010–1.025)
Urobilinogen, UA: 0.2 E.U./dL
pH, UA: 5.5 (ref 5.0–8.0)

## 2017-11-04 MED ORDER — LEVOFLOXACIN 500 MG PO TABS: 500.0000 mg | ORAL_TABLET | Freq: Every day | ORAL | 0 refills | Status: DC

## 2017-11-04 NOTE — Progress Notes (Addendum)
OFFICE VISIT  11/04/2017   CC:  Chief Complaint  Patient presents with  . Groin Pain    Left side,    HPI:    Patient is a 62 y.o. Caucasian male who presents for left groin pain. Has been having intermittent LLQ and L groin pain, getting more constant, with an uncomfortable sensation in abd in LLQ that intermittently gets worse--but pt not sure what makes it worse.  Takes advil for knee a lot recently but this does not affect the pain.  No n/v/d or constipation.  No hx of abd trauma. Denies seeing a bulge in the region.  Describes the discomfort as more of a burn--not intense but "just doesn't feeling right".  Passing gas or having a BM does not affect it.  No blood in stool.  No fevers. Says it feels like what he had in the past when he had L inguinal hernia.  Has remote hx of L inguinal hernia repair (1970s).  Of note, he is about 7 wks s/p L knee surgery.  Feels much better.  Last several months: worsened urinary complaints:  nocturia x 4-5, urinary frequency during day as well, urgency and then not much to empty, stream not as forceful as it used to be. Symptoms occurring in mild intensity for about 1 yr--significantly worse x 3-4 mo.  No gross hematuria.  No pain with urination. Impacting his quality of life mildly--just a nuisance per his report today.    Past Medical History:  Diagnosis Date  . Arthritis of knee, left   . DDD (degenerative disc disease), lumbar   . Dysthymia   . Epidermoid cyst of skin   . GAD (generalized anxiety disorder)   . Hx of adenomatous polyp of colon 2008; 2013   Recall 05/2017 (Dr. Hervey Ard, [place], [place].  . Insomnia secondary to anxiety    stress mainly related to work    Past Surgical History:  Procedure Laterality Date  . APPENDECTOMY  1968  . BACK SURGERY  2012  . COLONOSCOPY  2008'; 05/20/2012   2008; adenomatous polyps.  2013 Tubular adenoma:--recall 5 yrs.    Diverticulosis - Dr. Kellie Moor  .  HERNIA REPAIR  1977   L inguinal  . JOINT REPLACEMENT    . KNEE ARTHROPLASTY Left 09/15/2017   Procedure: LEFT TOTAL KNEE ARTHROPLASTY WITH COMPUTER NAVIGATION;  Surgeon: Rod Can, MD;  Location: [facility];  Service: Orthopedics;  Laterality: Left;  Needs RNFA  . KNEE ARTHROSCOPY     Bilateral  . KNEE SURGERY     L x 2, R x1  . TONSILLECTOMY AND ADENOIDECTOMY  1985  . TOTAL SHOULDER REPLACEMENT     Left   MEDS: ibuprofen is his only med at this time.   No Known Allergies  ROS As per HPI  PE: Blood pressure 137/88, pulse 88, temperature 98.3 F (36.8 C), temperature source Oral, resp. rate 16, weight 242 lb (109.8 kg), SpO2 95 %. Gen: Alert, well appearing.  Patient is oriented to person, place, time, and situation. AFFECT: pleasant, lucid thought and speech. OBS:JGGE: no injection, icteris, swelling, or exudate.  EOMI, PERRLA. Mouth: lips without lesion/swelling.  Oral mucosa pink and moist. Oropharynx without erythema, exudate, or swelling.  CV: RRR, no m/r/g.   LUNGS: CTA bilat, nonlabored resps, good aeration in all lung fields. ABD: soft, nondistended, BS normal, without HSM, mass, or bruit. He has mild TTP in umbilical region and across lower abdomen and in L groin.  No guarding  or rebound. With abd crunch while I palpate in LLQ his pain in the area dose not change. L groin: no mass or nodule or swelling to suggest a hernia. Genitals normal; both testes normal without tenderness, masses, hydroceles, varicoceles, erythema or swelling. Shaft normal, uncircumcised, meatus normal without discharge. No inguinal hernia noted. No inguinal lymphadenopathy. Rectal exam: negative without mass, lesions or tenderness, PROSTATE EXAM: smooth and symmetric without nodules or tenderness, normal in size, tenderness noted.    LABS:    Chemistry      Component Value Date/Time   NA 138 09/16/2017 0544   NA 145 02/13/2012   K 4.0 09/16/2017 0544   CL 106 09/16/2017 0544   CO2 27  09/16/2017 0544   BUN 13 09/16/2017 0544   BUN 14 02/13/2012   CREATININE 0.72 09/16/2017 0544   GLU 83 02/13/2012      Component Value Date/Time   CALCIUM 8.1 (L) 09/16/2017 0544   ALKPHOS 63 06/05/2017 1338   AST 21 06/05/2017 1338   ALT 31 06/05/2017 1338   BILITOT 0.4 06/05/2017 1338     Lab Results  Component Value Date   WBC 11.0 (H) 09/16/2017   HGB 12.1 (L) 09/16/2017   HCT 36.6 (L) 09/16/2017   MCV 92.2 09/16/2017   PLT 128 (L) 09/16/2017   CC UA today: normal  IMPRESSION AND PLAN:  1) Chronic Lower abd pain, worse in LLQ and L groin. Seems to be getting worse.  No clear etiology. Watchful waiting approach the last 4-5 mo ---will do abd/pelv CT with contrast to further evaluate.  2) Acute prostatitis superimposed on chronic BPH with LUTS:  Levaquin 500 mg qd x 14d. If improved but not back to baseline in 2 wks, will give additional 2 wks of levaquin. If not improved then will start flomax.  Spent 40 min with pt today, with >50% of this time spent in counseling and care coordination regarding the above problems.  An After Visit Summary was printed and given to the patient.  FOLLOW UP: Return in about 2 weeks (around 11/18/2017) for f/u abd/urine sx's.  Signed:  Crissie Sickles, MD           11/04/2017

## 2017-11-11 ENCOUNTER — Other Ambulatory Visit: Payer: BLUE CROSS/BLUE SHIELD

## 2017-11-17 ENCOUNTER — Other Ambulatory Visit: Payer: Self-pay | Admitting: Family Medicine

## 2017-11-17 ENCOUNTER — Ambulatory Visit
Admission: RE | Admit: 2017-11-17 | Discharge: 2017-11-17 | Disposition: A | Discharge status: Home / Self Care | Payer: BLUE CROSS/BLUE SHIELD | Source: Ambulatory Visit | Attending: Family Medicine | Admitting: Family Medicine

## 2017-11-17 ENCOUNTER — Encounter: Payer: Self-pay | Admitting: Family Medicine

## 2017-11-17 DIAGNOSIS — K4020 Bilateral inguinal hernia, without obstruction or gangrene, not specified as recurrent: Secondary | ICD-10-CM

## 2017-11-17 DIAGNOSIS — K4090 Unilateral inguinal hernia, without obstruction or gangrene, not specified as recurrent: Secondary | ICD-10-CM | POA: Diagnosis not present

## 2017-11-17 DIAGNOSIS — Z9889 Other specified postprocedural states: Secondary | ICD-10-CM

## 2017-11-17 DIAGNOSIS — R1030 Lower abdominal pain, unspecified: Secondary | ICD-10-CM

## 2017-11-17 DIAGNOSIS — Z8719 Personal history of other diseases of the digestive system: Secondary | ICD-10-CM

## 2017-11-17 DIAGNOSIS — R1032 Left lower quadrant pain: Secondary | ICD-10-CM

## 2017-11-17 MED ORDER — IOPAMIDOL (ISOVUE-300) INJECTION 61%: 100.0000 mL | Freq: Once | INTRAVENOUS | Status: DC | PRN

## 2017-11-18 ENCOUNTER — Ambulatory Visit (INDEPENDENT_AMBULATORY_CARE_PROVIDER_SITE_OTHER): Payer: BLUE CROSS/BLUE SHIELD | Admitting: Family Medicine

## 2017-11-18 ENCOUNTER — Encounter: Payer: Self-pay | Admitting: Family Medicine

## 2017-11-18 VITALS — BP 143/91 | HR 84 | Temp 98.4°F | Resp 16 | Wt 243.0 lb

## 2017-11-18 DIAGNOSIS — N410 Acute prostatitis: Secondary | ICD-10-CM | POA: Diagnosis not present

## 2017-11-18 DIAGNOSIS — R1031 Right lower quadrant pain: Secondary | ICD-10-CM

## 2017-11-18 DIAGNOSIS — N401 Enlarged prostate with lower urinary tract symptoms: Secondary | ICD-10-CM | POA: Diagnosis not present

## 2017-11-18 DIAGNOSIS — R1032 Left lower quadrant pain: Secondary | ICD-10-CM

## 2017-11-18 DIAGNOSIS — K4020 Bilateral inguinal hernia, without obstruction or gangrene, not specified as recurrent: Secondary | ICD-10-CM

## 2017-11-18 DIAGNOSIS — N138 Other obstructive and reflux uropathy: Secondary | ICD-10-CM | POA: Diagnosis not present

## 2017-11-18 DIAGNOSIS — G8929 Other chronic pain: Secondary | ICD-10-CM

## 2017-11-18 MED ORDER — LEVOFLOXACIN 500 MG PO TABS: 500.0000 mg | ORAL_TABLET | Freq: Every day | ORAL | 0 refills | Status: DC

## 2017-11-18 NOTE — Progress Notes (Signed)
OFFICE VISIT  11/18/2017   CC:  Chief Complaint  Patient presents with  . Follow-up    abd pain   HPI:    Patient is a 62 y.o. Caucasian male who presents for 2 week f/u lower abd pain and acute prostatitis. CT obtained yesterday for the lower abd pain showed bilat, L>R inguinal hernias containing fat.  Otherwise, the CT showed minimal diverticulosis w/out diverticulitis, and a diverticulum arising from the posterior aspect of the gastric fundus containing retained GI contrast and fluid.  I notified him of results and recommended gen surg referral and he was in favor of this--referral ordered yesterday. We reviewed his CT report and images today.    I felt like he had acute prostatitis superimposed on BPH with LUTS.  I rx'd a 2 week course of levaquin 500 mg qd. He says he is MUCH improved regarding urinary sx's: urine stream more forceful, nocturia x 1-2 now, less daytime frequency and frequency MUCh improved, better bladder emptying.  Says he doesn't think he is quite back to his baseline regarding these sx's, though.  He is in favor of trying 2 more weeks of antibiotics to see if infectious prostatitis still not completely resolved.  Past Medical History:  Diagnosis Date  . Arthritis of knee, left   . DDD (degenerative disc disease), lumbar   . Dysthymia   . Epidermoid cyst of skin   . GAD (generalized anxiety disorder)   . Hx of adenomatous polyp of colon 2008; 2013   Recall 05/2017 (Dr. Hervey Ard, [place], [place].  . Inguinal hernia recurrent bilateral 10/2017   CT abd/pelv.  Fat-containing.  +Symptomatic.  Referred to gen surg 11/17/17.  . Insomnia secondary to anxiety    stress mainly related to work    Past Surgical History:  Procedure Laterality Date  . APPENDECTOMY  1968  . BACK SURGERY  2012  . COLONOSCOPY  2008'; 05/20/2012   2008; adenomatous polyps.  2013 Tubular adenoma:--recall 5 yrs.    Diverticulosis - Dr. Kellie Moor  .  HERNIA REPAIR  1977   L inguinal  . JOINT REPLACEMENT    . KNEE ARTHROPLASTY Left 09/15/2017   Procedure: LEFT TOTAL KNEE ARTHROPLASTY WITH COMPUTER NAVIGATION;  Surgeon: Rod Can, MD;  Location: [facility];  Service: Orthopedics;  Laterality: Left;  Needs RNFA  . KNEE ARTHROSCOPY     Bilateral  . KNEE SURGERY     L x 2, R x1  . TONSILLECTOMY AND ADENOIDECTOMY  1985  . TOTAL SHOULDER REPLACEMENT     Left    Outpatient Medications Prior to Visit  Medication Sig Dispense Refill  . celecoxib (CELEBREX) 200 MG capsule Take by mouth daily.  3  . levofloxacin (LEVAQUIN) 500 MG tablet Take 1 tablet (500 mg total) by mouth daily. 14 tablet 0  . ibuprofen (ADVIL,MOTRIN) 200 MG tablet Take 400 mg every 8 (eight) hours as needed by mouth for mild pain (takes at least once every day).    Marland Kitchen aspirin 81 MG chewable tablet Chew 1 tablet (81 mg total) by mouth 2 (two) times daily. (Patient not taking: Reported on 11/04/2017) 60 tablet 1  . docusate sodium (COLACE) 100 MG capsule Take 1 capsule (100 mg total) by mouth 2 (two) times daily. (Patient not taking: Reported on 11/04/2017) 60 capsule 1  . HYDROcodone-acetaminophen (NORCO/VICODIN) 5-325 MG tablet Take 1-2 tablets by mouth every 6 (six) hours as needed (breakthrough knee pain). (Patient not taking: Reported on 11/04/2017) 60 tablet 0  .  ondansetron (ZOFRAN) 4 MG tablet Take 1 tablet (4 mg total) by mouth every 6 (six) hours as needed for nausea. (Patient not taking: Reported on 11/04/2017) 20 tablet 0  . oxyCODONE (OXYCONTIN) 10 mg 12 hr tablet Take 1 tablet (10 mg total) by mouth PRO. 1 tab PO every 12 hours for 3 days, then 1 tab PO daily for 4 days (Patient not taking: Reported on 11/04/2017) 10 tablet 0  . senna (SENOKOT) 8.6 MG TABS tablet Take 2 tablets (17.2 mg total) by mouth at bedtime. (Patient not taking: Reported on 11/04/2017) 120 each 0   No facility-administered medications prior to visit.     No Known Allergies  ROS As per  HPI  PE: Blood pressure (!) 143/91, pulse 84, temperature 98.4 F (36.9 C), temperature source Oral, resp. rate 16, weight 243 lb (110.2 kg), SpO2 96 %. Gen: Alert, well appearing.  Patient is oriented to person, place, time, and situation. AFFECT: pleasant, lucid thought and speech. No further exam today.  LABS:    Chemistry      Component Value Date/Time   NA 138 09/16/2017 0544   NA 145 02/13/2012   K 4.0 09/16/2017 0544   CL 106 09/16/2017 0544   CO2 27 09/16/2017 0544   BUN 13 09/16/2017 0544   BUN 14 02/13/2012   CREATININE 0.72 09/16/2017 0544   GLU 83 02/13/2012      Component Value Date/Time   CALCIUM 8.1 (L) 09/16/2017 0544   ALKPHOS 63 06/05/2017 1338   AST 21 06/05/2017 1338   ALT 31 06/05/2017 1338   BILITOT 0.4 06/05/2017 1338       IMPRESSION AND PLAN:  1) Lower abd pain, chronic and mild. CT abd/pelv w/contrast showing L>R inguinal hernias containing fat. We discussed that the CT findings may clinically correlate to his abd symptoms.  Proceed with gen surg referral to get their expert opinion and discuss any further w/u +/- treatment options.  2) Acute prostatitis superimposed on BPH with lower urinary tract obstructive sx's. Much improved with 2 weeks of levaquiin, but will give another 2 weeks of levaquin since chronic prostatitis sometimes takes this long to treat.  If not further improved after another 2 wks of levaquin, I recommend we start trial of flomax for BPH. Pt will call and report how he's doing in about 2 weeks, earlier if needed.  An After Visit Summary was printed and given to the patient.  FOLLOW UP: Return for as needed.  Signed:  Crissie Sickles, MD           11/18/2017

## 2017-11-21 ENCOUNTER — Encounter: Payer: Self-pay | Admitting: Family Medicine

## 2017-12-03 ENCOUNTER — Encounter: Payer: Self-pay | Admitting: Family Medicine

## 2017-12-03 DIAGNOSIS — M25571 Pain in right ankle and joints of right foot: Secondary | ICD-10-CM | POA: Diagnosis not present

## 2017-12-03 DIAGNOSIS — S93491A Sprain of other ligament of right ankle, initial encounter: Secondary | ICD-10-CM | POA: Diagnosis not present

## 2017-12-04 ENCOUNTER — Encounter: Payer: Self-pay | Admitting: Family Medicine

## 2017-12-04 ENCOUNTER — Ambulatory Visit (INDEPENDENT_AMBULATORY_CARE_PROVIDER_SITE_OTHER): Payer: BLUE CROSS/BLUE SHIELD | Admitting: Family Medicine

## 2017-12-04 ENCOUNTER — Ambulatory Visit: Payer: BLUE CROSS/BLUE SHIELD | Admitting: Family Medicine

## 2017-12-04 VITALS — BP 136/88 | HR 83 | Temp 98.5°F | Resp 15 | Wt 245.0 lb

## 2017-12-04 DIAGNOSIS — M19011 Primary osteoarthritis, right shoulder: Secondary | ICD-10-CM

## 2017-12-04 DIAGNOSIS — Z01818 Encounter for other preprocedural examination: Secondary | ICD-10-CM | POA: Diagnosis not present

## 2017-12-04 DIAGNOSIS — K4091 Unilateral inguinal hernia, without obstruction or gangrene, recurrent: Secondary | ICD-10-CM | POA: Diagnosis not present

## 2017-12-04 DIAGNOSIS — K4020 Bilateral inguinal hernia, without obstruction or gangrene, not specified as recurrent: Secondary | ICD-10-CM | POA: Diagnosis not present

## 2017-12-04 NOTE — Progress Notes (Addendum)
Office Note 12/04/2017  CC:  Chief Complaint  Patient presents with  . Pre-op Exam    HPI:  Anthony Barnett is a 62 y.o. White male who is here today for pre-surgical clearance for upcoming shoulder surgery. He is having right shoulder replacement at [facility] on 12/10/17. The surgeon who did his other shoulder surgery is located there.  Pt has no history of CAD, PAD, CHF, cardiac valvular dz, cardiac implantable electronic device, pulm HTN, or severe systemic dz. Functional status:  He does not smoke and never has been a smoker. No snoring or apneic events in sleep have been noted. Denies any problems with general anesthesia in the past.  He has no hx of DM, HTN, HLD, or chronic kidney disease.  He saw gen surg today and he was told that he needs to get laparoscopic hernia repair surgery. Pt wants to put this off for a while, understandably.   Past Medical History:  Diagnosis Date  . Arthritis of knee, left    TKA 10/2017 ([facility])  . DDD (degenerative disc disease), lumbar   . Dysthymia   . Epidermoid cyst of skin   . GAD (generalized anxiety disorder)   . Hx of adenomatous polyp of colon 2008; 2013   Recall 05/2017 (Dr. Hervey Ard, [place], [place].  . Inguinal hernia recurrent bilateral 10/2017   CT abd/pelv.  Fat-containing.  +Symptomatic.  Referred to gen surg 11/17/17.  . Insomnia secondary to anxiety    stress mainly related to work  . Obesity, Class I, BMI 30-34.9     Past Surgical History:  Procedure Laterality Date  . APPENDECTOMY  1968  . BACK SURGERY  2012  . COLONOSCOPY  2008'; 05/20/2012   2008; adenomatous polyps.  2013 Tubular adenoma:--recall 5 yrs.    Diverticulosis - Dr. Kellie Moor  . HERNIA REPAIR  1977   L inguinal  . KNEE ARTHROPLASTY Left 09/15/2017   Procedure: LEFT TOTAL KNEE ARTHROPLASTY WITH COMPUTER NAVIGATION;  Surgeon: Rod Can, MD;  Location: [facility];   Service: Orthopedics;  Laterality: Left;  Needs RNFA  . KNEE ARTHROSCOPY     Bilateral  . KNEE SURGERY     L x 2, R x1  . TONSILLECTOMY AND ADENOIDECTOMY  1985  . TOTAL SHOULDER REPLACEMENT     Left    Family History  Problem Relation Age of Onset  . Colon cancer Maternal Grandfather 25    Social History   Socioeconomic History  . Marital status: Married    Spouse name: Not on file  . Number of children: Not on file  . Years of education: Not on file  . Highest education level: Not on file  Social Needs  . Financial resource strain: Not on file  . Food insecurity - worry: Not on file  . Food insecurity - inability: Not on file  . Transportation needs - medical: Not on file  . Transportation needs - non-medical: Not on file  Occupational History  . Not on file  Tobacco Use  . Smoking status: Never Smoker  . Smokeless tobacco: Never Used  Substance and Sexual Activity  . Alcohol use: Yes    Alcohol/week: 1.8 oz    Types: 3 Glasses of wine per week  . Drug use: No  . Sexual activity: Not on file  Other Topics Concern  . Not on file  Social History Narrative   Married, 1 daughter and 1 son.   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   No tobacco.   Alc: occ wine.    MEDS: currently taking no rx meds.  No Known Allergies  ROS Review of Systems  Constitutional: Negative for appetite change, chills, fatigue and fever.  HENT: Negative for congestion, dental problem, ear pain and sore throat.   Eyes: Negative for discharge, redness and visual disturbance.  Respiratory: Negative for cough, chest tightness, shortness of breath and wheezing.   Cardiovascular: Negative for chest pain, palpitations and leg swelling.  Gastrointestinal: Negative for abdominal pain, blood in stool, diarrhea, nausea and vomiting.  Genitourinary: Negative for difficulty urinating, dysuria, flank pain, frequency, hematuria and urgency.  Musculoskeletal:  Positive for arthralgias (chronic R shoulder pain). Negative for back pain, joint swelling, myalgias and neck stiffness.  Skin: Negative for pallor and rash.  Neurological: Negative for dizziness, speech difficulty, weakness and headaches.  Hematological: Negative for adenopathy. Does not bruise/bleed easily.  Psychiatric/Behavioral: Negative for confusion and sleep disturbance. The patient is not nervous/anxious.     PE; BP repeat today was 136/88 Blood pressure 136/88, pulse 83, temperature 98.5 F (36.9 C), temperature source Oral, resp. rate 15, weight 245 lb (111.1 kg), SpO2 96 %. Body mass index is 33.23 kg/m.  Gen: Alert, well appearing.  Patient is oriented to person, place, time, and situation. AFFECT: pleasant, lucid thought and speech. ENT:   Eyes: no injection, icteris, swelling, or exudate.  EOMI, PERRLA. Nose: no drainage or turbinate edema/swelling.  No injection or focal lesion.  Mouth: lips without lesion/swelling.  Oral mucosa pink and moist.  Dentition intact and without obvious caries or gingival swelling.  Oropharynx without erythema, exudate, or swelling.  Neck: supple/nontender.  No LAD, mass, or TM.  Carotid pulses 2+ bilaterally, without bruits. CV: RRR, no m/r/g.   LUNGS: CTA bilat, nonlabored resps, good aeration in all lung fields. ABD: soft, NT, ND, BS normal.  No hepatospenomegaly or mass.  No bruits. EXT: no clubbing, cyanosis, or edema.  Musculoskeletal: no joint swelling, erythema, warmth, or tenderness.   Skin - no sores or suspicious lesions or rashes or color changes   Pertinent labs:  Lab Results  Component Value Date   TSH 2.78 02/13/2012   Lab Results  Component Value Date   WBC 11.0 (H) 09/16/2017   HGB 12.1 (L) 09/16/2017   HCT 36.6 (L) 09/16/2017   MCV 92.2 09/16/2017   PLT 128 (L) 09/16/2017   Of note: the above CBC was done right after L total knee arthroplasty  Lab Results  Component Value Date   CREATININE 0.72 09/16/2017   BUN  13 09/16/2017   NA 138 09/16/2017   K 4.0 09/16/2017   CL 106 09/16/2017   CO2 27 09/16/2017   Lab Results  Component Value Date   ALT 31 06/05/2017   AST 21 06/05/2017   ALKPHOS 63 06/05/2017   BILITOT 0.4 06/05/2017   Lab Results  Component Value Date   CHOL 197 02/13/2012   Lab Results  Component Value Date   HDL 59 02/13/2012   Lab Results  Component Value Date   LDLCALC 122 02/13/2012   Lab Results  Component Value Date   TRIG 81 02/13/2012   No results found for: CHOLHDL  12 lead EKG today: NSR, rate 82, delayed R wave progression.  No ST/T wave abnormalities, no Q waves, no hypertrophy, no ectopy.  Intervals and duration normal. Compared to EKG 02/13/12, the inverted T waves in lead III are  now gone.  ASSESSMENT AND PLAN:   Preoperative clearance assessment for right shoulder replacement (end stage DJD R shoulder). He has no history of cardiopulmonary disease or systemic disease. No modifiable risk factors for CAD. Low risk for his planned shoulder replacement surgery. CBC, CMET today. EKG normal today. No meds recommended at this time. Will send letter of medical clearance with his EKG and labs when his lab results return.  An After Visit Summary was printed and given to the patient.  FOLLOW UP:  Return for follow up as needed.  Signed:  Crissie Sickles, MD           12/04/2017

## 2017-12-05 ENCOUNTER — Encounter: Payer: Self-pay | Admitting: Family Medicine

## 2017-12-05 LAB — CBC WITH DIFFERENTIAL/PLATELET
BASOS ABS: 0.2 10*3/uL — AB (ref 0.0–0.1)
Basophils Relative: 1.8 % (ref 0.0–3.0)
EOS ABS: 0.1 10*3/uL (ref 0.0–0.7)
Eosinophils Relative: 1.3 % (ref 0.0–5.0)
HCT: 43.2 % (ref 39.0–52.0)
Hemoglobin: 14.3 g/dL (ref 13.0–17.0)
LYMPHS ABS: 2.6 10*3/uL (ref 0.7–4.0)
LYMPHS PCT: 28.5 % (ref 12.0–46.0)
MCHC: 33.1 g/dL (ref 30.0–36.0)
MCV: 90.5 fl (ref 78.0–100.0)
MONOS PCT: 8.2 % (ref 3.0–12.0)
Monocytes Absolute: 0.8 10*3/uL (ref 0.1–1.0)
NEUTROS ABS: 5.6 10*3/uL (ref 1.4–7.7)
Neutrophils Relative %: 60.2 % (ref 43.0–77.0)
Platelets: 195 10*3/uL (ref 150.0–400.0)
RBC: 4.78 Mil/uL (ref 4.22–5.81)
RDW: 14.5 % (ref 11.5–15.5)
WBC: 9.3 10*3/uL (ref 4.0–10.5)

## 2017-12-05 LAB — COMPREHENSIVE METABOLIC PANEL
ALK PHOS: 83 U/L (ref 39–117)
ALT: 24 U/L (ref 0–53)
AST: 20 U/L (ref 0–37)
Albumin: 4.4 g/dL (ref 3.5–5.2)
BILIRUBIN TOTAL: 0.4 mg/dL (ref 0.2–1.2)
BUN: 19 mg/dL (ref 6–23)
CO2: 30 mEq/L (ref 19–32)
CREATININE: 0.86 mg/dL (ref 0.40–1.50)
Calcium: 9.3 mg/dL (ref 8.4–10.5)
Chloride: 103 mEq/L (ref 96–112)
GFR: 96.25 mL/min (ref >60.00)
Glucose, Bld: 92 mg/dL (ref 70–99)
Potassium: 4.4 mEq/L (ref 3.5–5.1)
Sodium: 141 mEq/L (ref 135–145)
TOTAL PROTEIN: 7 g/dL (ref 6.0–8.3)

## 2017-12-10 HISTORY — PX: TOTAL SHOULDER REPLACEMENT: SUR1217

## 2017-12-12 ENCOUNTER — Encounter: Payer: Self-pay | Admitting: Family Medicine

## 2017-12-24 DIAGNOSIS — M1711 Unilateral primary osteoarthritis, right knee: Secondary | ICD-10-CM | POA: Diagnosis not present

## 2017-12-24 DIAGNOSIS — Z96652 Presence of left artificial knee joint: Secondary | ICD-10-CM | POA: Diagnosis not present

## 2017-12-24 DIAGNOSIS — M7631 Iliotibial band syndrome, right leg: Secondary | ICD-10-CM | POA: Diagnosis not present

## 2017-12-24 DIAGNOSIS — M1712 Unilateral primary osteoarthritis, left knee: Secondary | ICD-10-CM | POA: Diagnosis not present

## 2018-01-16 DIAGNOSIS — M7751 Other enthesopathy of right foot: Secondary | ICD-10-CM | POA: Diagnosis not present

## 2018-01-16 DIAGNOSIS — S93411D Sprain of calcaneofibular ligament of right ankle, subsequent encounter: Secondary | ICD-10-CM | POA: Diagnosis not present

## 2018-01-19 DIAGNOSIS — M19011 Primary osteoarthritis, right shoulder: Secondary | ICD-10-CM | POA: Diagnosis not present

## 2018-01-19 DIAGNOSIS — S46201D Unspecified injury of muscle, fascia and tendon of other parts of biceps, right arm, subsequent encounter: Secondary | ICD-10-CM | POA: Diagnosis not present

## 2018-01-21 ENCOUNTER — Telehealth: Payer: Self-pay | Admitting: Family Medicine

## 2018-01-21 ENCOUNTER — Encounter: Payer: Self-pay | Admitting: Family Medicine

## 2018-01-21 ENCOUNTER — Ambulatory Visit (INDEPENDENT_AMBULATORY_CARE_PROVIDER_SITE_OTHER): Payer: BLUE CROSS/BLUE SHIELD | Admitting: Family Medicine

## 2018-01-21 VITALS — BP 124/88 | HR 90 | Temp 98.3°F | Resp 20 | Ht 72.0 in | Wt 241.5 lb

## 2018-01-21 DIAGNOSIS — J0100 Acute maxillary sinusitis, unspecified: Secondary | ICD-10-CM | POA: Diagnosis not present

## 2018-01-21 MED ORDER — BENZONATATE 200 MG PO CAPS: 200.0000 mg | ORAL_CAPSULE | Freq: Two times a day (BID) | ORAL | 0 refills | Status: DC | PRN

## 2018-01-21 MED ORDER — AMOXICILLIN-POT CLAVULANATE 875-125 MG PO TABS: 1.0000 | ORAL_TABLET | Freq: Two times a day (BID) | ORAL | 0 refills | Status: DC

## 2018-01-21 MED ORDER — FLUTICASONE PROPIONATE 50 MCG/ACT NA SUSP: 2.0000 | Freq: Every day | NASAL | 6 refills | Status: DC

## 2018-01-21 NOTE — Telephone Encounter (Signed)
Left message stating that Rx requested has been sent to pts pharmacy.

## 2018-01-21 NOTE — Telephone Encounter (Signed)
Flonase eRx'd.

## 2018-01-21 NOTE — Progress Notes (Signed)
Anthony Barnett , 1956/03/17, 62 y.o., male MRN: [id number] Patient Care Team    Relationship Specialty Notifications Start End  McGowen, Adrian Blackwater, MD PCP - General Family Medicine  06/05/17   Rod Can, MD Consulting Physician Orthopedic Surgery  12/03/17     Chief Complaint  Patient presents with  . URI    drainage,sore throat ,cough, congestionx 1 week     Subjective: Pt presents for an OV with complaints of produtive cough of 1 week  duration.  Associated symptoms include active cough, wheezing, sore throat, sinus pressure with headache and swollen glands right greater than left.  States he has been struggling with this for a little bit over a week.  He has concerns because he has 2 upcoming trips planned.  He denies fever, chills, nausea, vomiting or diarrhea.  No flowsheet data found.  No Known Allergies Social History   Tobacco Use  . Smoking status: Never Smoker  . Smokeless tobacco: Never Used  Substance Use Topics  . Alcohol use: Yes    Alcohol/week: 1.8 oz    Types: 3 Glasses of wine per week   Past Medical History:  Diagnosis Date  . Arthritis of knee, left    TKA 10/2017 ([facility])  . DDD (degenerative disc disease), lumbar   . Dysthymia   . Epidermoid cyst of skin   . GAD (generalized anxiety disorder)   . Hx of adenomatous polyp of colon 2008; 2013   Recall 05/2017 (Dr. Hervey Ard, [place], [place].  . Inguinal hernia recurrent bilateral 10/2017   CT abd/pelv.  Fat-containing.  +Symptomatic.  Referred to gen surg 11/17/17.  . Insomnia secondary to anxiety    stress mainly related to work  . Obesity, Class I, BMI 30-34.9    Past Surgical History:  Procedure Laterality Date  . APPENDECTOMY  1968  . BACK SURGERY  2012  . COLONOSCOPY  2008'; 05/20/2012   2008; adenomatous polyps.  2013 Tubular adenoma:--recall 5 yrs.    Diverticulosis - Dr. Kellie Moor  . HERNIA REPAIR  1977   L inguinal  . KNEE ARTHROPLASTY Left  09/15/2017   Procedure: LEFT TOTAL KNEE ARTHROPLASTY WITH COMPUTER NAVIGATION;  Surgeon: Rod Can, MD;  Location: [facility];  Service: Orthopedics;  Laterality: Left;  Needs RNFA  . KNEE ARTHROSCOPY     Bilateral  . KNEE SURGERY     L x 2, R x1  . TONSILLECTOMY AND ADENOIDECTOMY  1985  . TOTAL SHOULDER REPLACEMENT     Left   . TOTAL SHOULDER REPLACEMENT Right 12/10/2017   Done at [facility] in [place], [place].   Family History  Problem Relation Age of Onset  . Colon cancer Maternal Grandfather 88   Allergies as of 01/21/2018   No Known Allergies     Medication List        Accurate as of 01/21/18  2:30 PM. Always use your most recent med list.          celecoxib 200 MG capsule Commonly known as:  CELEBREX Take by mouth daily.   ibuprofen 200 MG tablet Commonly known as:  ADVIL,MOTRIN Take 400 mg every 8 (eight) hours as needed by mouth for mild pain (takes at least once every day).       All past medical history, surgical history, allergies, family history, immunizations andmedications were updated in the EMR today and reviewed under the history and medication portions of their EMR.     ROS: Negative, with the exception  of above mentioned in HPI   Objective:  BP 124/88 (BP Location: Left Arm, Patient Position: Sitting, Cuff Size: Large)   Pulse 90   Temp 98.3 F (36.8 C)   Resp 20   Ht 6' (1.829 m)   Wt 241 lb 8 oz (109.5 kg)   SpO2 97%   BMI 32.75 kg/m  Body mass index is 32.75 kg/m. Gen: Afebrile. No acute distress. Nontoxic in appearance, well developed, well nourished.  HENT: AT. [place]. Bilateral TM visualized lateral fullness, no erythema. MMM, no oral lesions. Bilateral nares with erythema, swelling and drainage. Throat without erythema or exudates.  Cough present.  Palpation maxillary sinus. Eyes:Pupils Equal Round Reactive to light, Extraocular movements intact,  Conjunctiva without redness, discharge or icterus. Neck/lymp/endocrine: Supple, no  lymphadenopathy CV: RRR  Chest: CTAB, no wheeze or crackles. Good air movement, normal resp effort.   No exam data present No results found. No results found for this or any previous visit (from the past 24 hour(s)).  Assessment/Plan: Abdel Effinger is a 62 y.o. male present for OV for  Acute non-recurrent maxillary sinusitis Rest, hydrate.  + flonase, mucinex (DM if cough), nettie pot or nasal saline.  Augmentin prescribed, take until completed.  Tessalon Perles for cough If cough present it can last up to 6-8 weeks.  F/U 2 weeks of not improved.     Reviewed expectations re: course of current medical issues.  Discussed self-management of symptoms.  Outlined signs and symptoms indicating need for more acute intervention.  Patient verbalized understanding and all questions were answered.  Patient received an After-Visit Summary.    No orders of the defined types were placed in this encounter.    Note is dictated utilizing voice recognition software. Although note has been proof read prior to signing, occasional typographical errors still can be missed. If any questions arise, please do not hesitate to call for verification.   electronically signed by:  Howard Pouch, DO  [place]

## 2018-01-21 NOTE — Telephone Encounter (Signed)
Medication not on pts med list. Please advise. Thanks.

## 2018-01-21 NOTE — Telephone Encounter (Signed)
Copied from [place] [id number]. Topic: Quick Communication - See Telephone Encounter >> Jan 21, 2018  4:00 PM Aurelio Brash B wrote: CRM for notification. See Telephone encounter for: 01/21/18. Christine from [facility] is requesting a  Rx for flonase   for the pt  [place], [place] [phone] (Phone) [phone] (Fax)

## 2018-01-21 NOTE — Patient Instructions (Signed)
Rest, hydrate.  + flonase, mucinex DM if cough, nettie pot or nasal saline.  Tessalon perles also for cough.  Augmentin  prescribed, take until completed.  If cough present it can last up to 6-8 weeks.  F/U 2 weeks of not improved.    Sinusitis, Adult Sinusitis is soreness and inflammation of your sinuses. Sinuses are hollow spaces in the bones around your face. They are located:  Around your eyes.  In the middle of your forehead.  Behind your nose.  In your cheekbones.  Your sinuses and nasal passages are lined with a stringy fluid (mucus). Mucus normally drains out of your sinuses. When your nasal tissues get inflamed or swollen, the mucus can get trapped or blocked so air cannot flow through your sinuses. This lets bacteria, viruses, and funguses grow, and that leads to infection. Follow these instructions at home: Medicines  Take, use, or apply over-the-counter and prescription medicines only as told by your doctor. These may include nasal sprays.  If you were prescribed an antibiotic medicine, take it as told by your doctor. Do not stop taking the antibiotic even if you start to feel better. Hydrate and Humidify  Drink enough water to keep your pee (urine) clear or pale yellow.  Use a cool mist humidifier to keep the humidity level in your home above 50%.  Breathe in steam for 10-15 minutes, 3-4 times a day or as told by your doctor. You can do this in the bathroom while a hot shower is running.  Try not to spend time in cool or dry air. Rest  Rest as much as possible.  Sleep with your head raised (elevated).  Make sure to get enough sleep each night. General instructions  Put a warm, moist washcloth on your face 3-4 times a day or as told by your doctor. This will help with discomfort.  Wash your hands often with soap and water. If there is no soap and water, use hand sanitizer.  Do not smoke. Avoid being around people who are smoking (secondhand smoke).  Keep  all follow-up visits as told by your doctor. This is important. Contact a doctor if:  You have a fever.  Your symptoms get worse.  Your symptoms do not get better within 10 days. Get help right away if:  You have a very bad headache.  You cannot stop throwing up (vomiting).  You have pain or swelling around your face or eyes.  You have trouble seeing.  You feel confused.  Your neck is stiff.  You have trouble breathing. This information is not intended to replace advice given to you by your health care provider. Make sure you discuss any questions you have with your health care provider. Document Released: 03/25/2008 Document Revised: 06/02/2016 Document Reviewed: 08/02/2015 Elsevier Interactive Patient Education  [employer].

## 2018-01-22 DIAGNOSIS — M19011 Primary osteoarthritis, right shoulder: Secondary | ICD-10-CM | POA: Diagnosis not present

## 2018-02-03 DIAGNOSIS — M19011 Primary osteoarthritis, right shoulder: Secondary | ICD-10-CM | POA: Diagnosis not present

## 2018-02-05 DIAGNOSIS — M19011 Primary osteoarthritis, right shoulder: Secondary | ICD-10-CM | POA: Diagnosis not present

## 2018-02-12 DIAGNOSIS — M19011 Primary osteoarthritis, right shoulder: Secondary | ICD-10-CM | POA: Diagnosis not present

## 2018-02-14 DIAGNOSIS — M19011 Primary osteoarthritis, right shoulder: Secondary | ICD-10-CM | POA: Diagnosis not present

## 2018-02-15 ENCOUNTER — Encounter: Payer: Self-pay | Admitting: Family Medicine

## 2018-02-16 DIAGNOSIS — M19011 Primary osteoarthritis, right shoulder: Secondary | ICD-10-CM | POA: Diagnosis not present

## 2018-02-18 DIAGNOSIS — M19011 Primary osteoarthritis, right shoulder: Secondary | ICD-10-CM | POA: Diagnosis not present

## 2018-02-24 DIAGNOSIS — M19011 Primary osteoarthritis, right shoulder: Secondary | ICD-10-CM | POA: Diagnosis not present

## 2018-02-26 DIAGNOSIS — M19011 Primary osteoarthritis, right shoulder: Secondary | ICD-10-CM | POA: Diagnosis not present

## 2018-03-02 DIAGNOSIS — M19011 Primary osteoarthritis, right shoulder: Secondary | ICD-10-CM | POA: Diagnosis not present

## 2018-03-05 DIAGNOSIS — M19011 Primary osteoarthritis, right shoulder: Secondary | ICD-10-CM | POA: Diagnosis not present

## 2018-03-19 DIAGNOSIS — M542 Cervicalgia: Secondary | ICD-10-CM | POA: Diagnosis not present

## 2018-03-23 DIAGNOSIS — Z96652 Presence of left artificial knee joint: Secondary | ICD-10-CM | POA: Diagnosis not present

## 2018-03-23 DIAGNOSIS — M25562 Pain in left knee: Secondary | ICD-10-CM | POA: Diagnosis not present

## 2018-03-25 DIAGNOSIS — M542 Cervicalgia: Secondary | ICD-10-CM | POA: Diagnosis not present

## 2018-03-29 ENCOUNTER — Encounter: Payer: Self-pay | Admitting: Family Medicine

## 2018-04-01 DIAGNOSIS — M542 Cervicalgia: Secondary | ICD-10-CM | POA: Diagnosis not present

## 2018-04-08 DIAGNOSIS — M542 Cervicalgia: Secondary | ICD-10-CM | POA: Diagnosis not present

## 2018-04-13 ENCOUNTER — Encounter: Payer: Self-pay | Admitting: Family Medicine

## 2018-04-13 DIAGNOSIS — M542 Cervicalgia: Secondary | ICD-10-CM | POA: Diagnosis not present

## 2018-04-16 DIAGNOSIS — M542 Cervicalgia: Secondary | ICD-10-CM | POA: Diagnosis not present

## 2018-04-20 DIAGNOSIS — M542 Cervicalgia: Secondary | ICD-10-CM | POA: Diagnosis not present

## 2018-04-22 DIAGNOSIS — M542 Cervicalgia: Secondary | ICD-10-CM | POA: Diagnosis not present

## 2018-05-05 ENCOUNTER — Encounter: Payer: Self-pay | Admitting: Family Medicine

## 2018-05-19 DIAGNOSIS — M5030 Other cervical disc degeneration, unspecified cervical region: Secondary | ICD-10-CM | POA: Diagnosis not present

## 2018-06-01 DIAGNOSIS — M25552 Pain in left hip: Secondary | ICD-10-CM | POA: Diagnosis not present

## 2018-06-01 DIAGNOSIS — M1711 Unilateral primary osteoarthritis, right knee: Secondary | ICD-10-CM | POA: Diagnosis not present

## 2018-06-07 ENCOUNTER — Encounter: Payer: Self-pay | Admitting: Family Medicine

## 2018-06-09 DIAGNOSIS — M542 Cervicalgia: Secondary | ICD-10-CM | POA: Diagnosis not present

## 2018-06-09 DIAGNOSIS — M5030 Other cervical disc degeneration, unspecified cervical region: Secondary | ICD-10-CM | POA: Diagnosis not present

## 2018-06-18 ENCOUNTER — Encounter: Payer: Self-pay | Admitting: Family Medicine

## 2018-07-16 DIAGNOSIS — K4020 Bilateral inguinal hernia, without obstruction or gangrene, not specified as recurrent: Secondary | ICD-10-CM | POA: Diagnosis not present

## 2018-07-16 DIAGNOSIS — K429 Umbilical hernia without obstruction or gangrene: Secondary | ICD-10-CM | POA: Diagnosis not present

## 2018-07-16 DIAGNOSIS — K4091 Unilateral inguinal hernia, without obstruction or gangrene, recurrent: Secondary | ICD-10-CM | POA: Diagnosis not present

## 2018-09-24 ENCOUNTER — Encounter

## 2018-09-24 ENCOUNTER — Ambulatory Visit (INDEPENDENT_AMBULATORY_CARE_PROVIDER_SITE_OTHER): Payer: BLUE CROSS/BLUE SHIELD | Admitting: Family Medicine

## 2018-09-24 ENCOUNTER — Encounter: Payer: Self-pay | Admitting: Family Medicine

## 2018-09-24 VITALS — BP 115/73 | HR 88 | Temp 98.6°F | Resp 16 | Ht 72.0 in | Wt 247.5 lb

## 2018-09-24 DIAGNOSIS — J209 Acute bronchitis, unspecified: Secondary | ICD-10-CM

## 2018-09-24 DIAGNOSIS — J069 Acute upper respiratory infection, unspecified: Secondary | ICD-10-CM

## 2018-09-24 MED ORDER — DOXYCYCLINE HYCLATE 100 MG PO CAPS: 100.0000 mg | ORAL_CAPSULE | Freq: Two times a day (BID) | ORAL | 0 refills | Status: AC

## 2018-09-24 NOTE — Patient Instructions (Signed)
Get otc generic robitussin DM OR Mucinex DM and use as directed on the packaging for cough and congestion. Use otc generic saline nasal spray 2-3 times per day to irrigate/moisturize your nasal passages.   

## 2018-09-24 NOTE — Progress Notes (Signed)
OFFICE VISIT  09/24/2018   CC:  Chief Complaint  Patient presents with  . URI   HPI:    Patient is a 62 y.o. Caucasian male who presents for respiratory complaints. Onset of nasal congestion/sinus pressure about 1 week ago, lots of sneezing. Couple days ago started getting more coughing/hacking.  Has mild HA. No fever.  Slight chest tightness last 24h.  Not much phlegm.  Eating and drinking well. No ear pain. Mild HA.  Mild ST. The nasal cong/sneezing has improved some. Taking dayquil and nyquil x last 3d. He got flu vaccine 08/06/18. No n/v/d.  Past Medical History:  Diagnosis Date  . Allergic rhinitis   . Cervical spinal stenosis 2019   Chronic cervicalgia.  Failed conservative tx/PT-->[facility]: MRI multilevel DDD, w/multilevel foraminal stenosis, with disc extrusion at C6-7.-->referred to PT.-->got worse, so ESI done 05/2018--helped.  . DDD (degenerative disc disease), lumbar   . Dysthymia   . Epidermoid cyst of skin   . GAD (generalized anxiety disorder)   . Hx of adenomatous polyp of colon 2008; 2013   Recall 05/2017 (Dr. Hervey Ard, [place], [place].  . Inguinal hernia recurrent bilateral 10/2017   CT abd/pelv.  Fat-containing.  +Symptomatic.  Referred to gen surg 11/17/17.  . Insomnia secondary to anxiety    stress mainly related to work  . Left hip pain    ortho 05/2018   . Obesity, Class I, BMI 30-34.9   . Osteoarthritis of both knees    TKA bilat; most recent 10/2017 [facility])    Past Surgical History:  Procedure Laterality Date  . APPENDECTOMY  1968  . BACK SURGERY  2012  . COLONOSCOPY  2008'; 05/20/2012   2008; adenomatous polyps.  2013 Tubular adenoma:--recall 5 yrs.    Diverticulosis - Dr. Kellie Moor  . HERNIA REPAIR  1977   L inguinal  . KNEE ARTHROPLASTY Left 09/15/2017   Procedure: LEFT TOTAL KNEE ARTHROPLASTY WITH COMPUTER NAVIGATION;  Surgeon: Rod Can, MD;  Location: [facility];  Service: Orthopedics;  Laterality:  Left;  Needs RNFA  . KNEE ARTHROSCOPY     Bilateral (L x 1, R x 1)  . TONSILLECTOMY AND ADENOIDECTOMY  1985  . TOTAL SHOULDER REPLACEMENT     Left   . TOTAL SHOULDER REPLACEMENT Right 12/10/2017   Done at [facility] in [place], [place].    Outpatient Medications Prior to Visit  Medication Sig Dispense Refill  . amoxicillin-clavulanate (AUGMENTIN) 875-125 MG tablet Take 1 tablet by mouth 2 (two) times daily. (Patient not taking: Reported on 09/24/2018) 20 tablet 0  . benzonatate (TESSALON) 200 MG capsule Take 1 capsule (200 mg total) by mouth 2 (two) times daily as needed for cough. (Patient not taking: Reported on 09/24/2018) 20 capsule 0  . celecoxib (CELEBREX) 200 MG capsule Take by mouth daily.  3  . fluticasone (FLONASE) 50 MCG/ACT nasal spray Place 2 sprays into both nostrils daily. (Patient not taking: Reported on 09/24/2018) 16 g 6  . ibuprofen (ADVIL,MOTRIN) 200 MG tablet Take 400 mg every 8 (eight) hours as needed by mouth for mild pain (takes at least once every day).     No facility-administered medications prior to visit.     No Known Allergies  ROS As per HPI  PE: Blood pressure 115/73, pulse 88, temperature 98.6 F (37 C), temperature source Oral, resp. rate 16, height 6' (1.829 m), weight 247 lb 8 oz (112.3 kg), SpO2 96 %. VS: noted--normal. Gen: alert, NAD, NONTOXIC APPEARING. HEENT: eyes without  injection, drainage, or swelling.  Ears: EACs clear, TMs with normal light reflex and landmarks.  Nose: Clear rhinorrhea, with some dried, crusty exudate adherent to mildly injected mucosa.  No purulent d/c.  No paranasal sinus TTP.  No facial swelling.  Throat and mouth without focal lesion.  No pharyngial swelling, erythema, or exudate.   Neck: supple, no LAD.   LUNGS: CTA bilat, nonlabored resps.   CV: RRR, no m/r/g. EXT: no c/c/e SKIN: no rash   LABS:    Chemistry      Component Value Date/Time   NA 141 12/04/2017 1608   NA 145 02/13/2012   K 4.4  12/04/2017 1608   CL 103 12/04/2017 1608   CO2 30 12/04/2017 1608   BUN 19 12/04/2017 1608   BUN 14 02/13/2012   CREATININE 0.86 12/04/2017 1608   GLU 83 02/13/2012      Component Value Date/Time   CALCIUM 9.3 12/04/2017 1608   ALKPHOS 83 12/04/2017 1608   AST 20 12/04/2017 1608   ALT 24 12/04/2017 1608   BILITOT 0.4 12/04/2017 1608      IMPRESSION AND PLAN:  URI with acute bronchitis, suspected viral etiology. However, with pt having surgery in 4d, he really wants to see if antibiotic helps.  He understands the potential risk of antibiotic use.  Doxy 100 mg bid x 7d.   Get otc generic robitussin DM OR Mucinex DM and use as directed on the packaging for cough and congestion. Use otc generic saline nasal spray 2-3 times per day to irrigate/moisturize your nasal passages.  An After Visit Summary was printed and given to the patient.  FOLLOW UP: Return if symptoms worsen or fail to improve.  Signed:  Crissie Sickles, MD           09/24/2018

## 2018-09-28 DIAGNOSIS — K4021 Bilateral inguinal hernia, without obstruction or gangrene, recurrent: Secondary | ICD-10-CM | POA: Diagnosis not present

## 2018-09-28 DIAGNOSIS — K429 Umbilical hernia without obstruction or gangrene: Secondary | ICD-10-CM | POA: Diagnosis not present

## 2018-10-27 DIAGNOSIS — M1711 Unilateral primary osteoarthritis, right knee: Secondary | ICD-10-CM | POA: Diagnosis not present

## 2018-11-04 ENCOUNTER — Encounter: Payer: Self-pay | Admitting: Family Medicine

## 2019-06-22 DIAGNOSIS — K760 Fatty (change of) liver, not elsewhere classified: Secondary | ICD-10-CM | POA: Insufficient documentation

## 2019-06-22 DIAGNOSIS — I700 Atherosclerosis of aorta: Secondary | ICD-10-CM

## 2019-06-22 HISTORY — DX: Fatty (change of) liver, not elsewhere classified: K76.0

## 2019-06-22 HISTORY — DX: Atherosclerosis of aorta: I70.0

## 2019-06-23 ENCOUNTER — Encounter

## 2019-06-24 ENCOUNTER — Ambulatory Visit: Payer: BLUE CROSS/BLUE SHIELD | Admitting: Family Medicine

## 2019-07-07 ENCOUNTER — Encounter: Payer: Self-pay | Admitting: Family Medicine

## 2019-07-07 ENCOUNTER — Ambulatory Visit (INDEPENDENT_AMBULATORY_CARE_PROVIDER_SITE_OTHER): Payer: No Typology Code available for payment source | Admitting: Family Medicine

## 2019-07-07 ENCOUNTER — Other Ambulatory Visit: Payer: Self-pay

## 2019-07-07 VITALS — BP 128/88 | HR 64 | Temp 98.3°F | Resp 16 | Ht 72.0 in | Wt 245.0 lb

## 2019-07-07 DIAGNOSIS — R1084 Generalized abdominal pain: Secondary | ICD-10-CM | POA: Diagnosis not present

## 2019-07-07 LAB — COMPREHENSIVE METABOLIC PANEL
ALT: 23 U/L (ref 0–53)
AST: 20 U/L (ref 0–37)
Albumin: 4.2 g/dL (ref 3.5–5.2)
Alkaline Phosphatase: 65 U/L (ref 39–117)
BUN: 21 mg/dL (ref 6–23)
CO2: 30 mEq/L (ref 19–32)
Calcium: 9.6 mg/dL (ref 8.4–10.5)
Chloride: 104 mEq/L (ref 96–112)
Creatinine, Ser: 0.86 mg/dL (ref 0.40–1.50)
GFR: 90.08 mL/min (ref >60.00)
Glucose, Bld: 96 mg/dL (ref 70–99)
Potassium: 4.8 mEq/L (ref 3.5–5.1)
Sodium: 142 mEq/L (ref 135–145)
Total Bilirubin: 0.6 mg/dL (ref 0.2–1.2)
Total Protein: 6.6 g/dL (ref 6.0–8.3)

## 2019-07-07 LAB — CBC WITH DIFFERENTIAL/PLATELET
Basophils Absolute: 0 10*3/uL (ref 0.0–0.1)
Basophils Relative: 0.4 % (ref 0.0–3.0)
Eosinophils Absolute: 0.1 10*3/uL (ref 0.0–0.7)
Eosinophils Relative: 1.4 % (ref 0.0–5.0)
HCT: 43.2 % (ref 39.0–52.0)
Hemoglobin: 14.2 g/dL (ref 13.0–17.0)
Lymphocytes Relative: 30.9 % (ref 12.0–46.0)
Lymphs Abs: 2.5 10*3/uL (ref 0.7–4.0)
MCHC: 32.8 g/dL (ref 30.0–36.0)
MCV: 94.4 fl (ref 78.0–100.0)
Monocytes Absolute: 0.8 10*3/uL (ref 0.1–1.0)
Monocytes Relative: 9.4 % (ref 3.0–12.0)
Neutro Abs: 4.6 10*3/uL (ref 1.4–7.7)
Neutrophils Relative %: 57.9 % (ref 43.0–77.0)
Platelets: 153 10*3/uL (ref 150.0–400.0)
RBC: 4.58 Mil/uL (ref 4.22–5.81)
RDW: 13.7 % (ref 11.5–15.5)
WBC: 8 10*3/uL (ref 4.0–10.5)

## 2019-07-07 MED ORDER — PANTOPRAZOLE SODIUM 40 MG PO TBEC: 40.0000 mg | DELAYED_RELEASE_TABLET | Freq: Every day | ORAL | 3 refills | Status: DC

## 2019-07-07 NOTE — Progress Notes (Signed)
OFFICE VISIT  07/07/2019   CC:  Chief Complaint  Patient presents with  . stomach pain    x 6-7 months, mild tenderness    HPI:    Patient is a 63 y.o. Caucasian male who presents for "stomach pain" for the last 6-7 months."    Hx of recurrent bilat inguinal hernias->fat-containing, +symptomatic->referred to gen surg 10/2017. He did get bilat inguin and  Surgical repair was done 09/2018. Things went fine after this until March or April.  Didn't feel like prev hernia pain. No preceding strain/trauma.  Eating doesn't change it.  Body position doesn't change it. No bloating or gas. Describes it as feeling tight, uncomfortable, mild intensity 2-4/10), worse after physical activity--generalized location but possible worse around umbilicus and below diffusely, but also some in mid epigastrum.  During the activity he feels no pain.  No n/v.  Appetite good.  No early satiety. Worse at night.  No change in bowel habits. No hematochezia, melena, constip, or diarrhea. He has been taking celebrex daily for the last 2 yrs rx'd by ortho in [place] after shoulder surgeries. Takes advil two days a week when playing golf. No change in food intake/diet prior to this.  No f/c/malaise or night sweats or unusual wt loss.   ROS: no CP, no SOB, no wheezing, no cough, no dizziness, no HAs, no rashes, no melena/hematochezia.  No polyuria or polydipsia.  No myalgias or arthralgias. No dysuria, no hematuria, no sense of incomplete emptying.  Past Medical History:  Diagnosis Date  . Allergic rhinitis   . Cervical spinal stenosis 2019   Chronic cervicalgia.  Failed conservative tx/PT-->[facility]: MRI multilevel DDD, w/multilevel foraminal stenosis, with disc extrusion at C6-7.-->referred to PT.-->got worse, so ESI done 05/2018--helped.  . DDD (degenerative disc disease), lumbar   . Dysthymia   . Epidermoid cyst of skin   . GAD (generalized anxiety disorder)   . Hx of adenomatous polyp of colon 2008;  2013   Recall 05/2017 (Dr. Hervey Ard, [place], [place].  . Inguinal hernia recurrent bilateral 10/2017   CT abd/pelv.  Fat-containing.  +Symptomatic.  Referred to gen surg 11/17/17.  . Insomnia secondary to anxiety    stress mainly related to work  . Left hip pain    ortho 05/2018   . Obesity, Class I, BMI 30-34.9   . Osteoarthritis of both knees    TKA on left; 10/2017 ([facility]).      Past Surgical History:  Procedure Laterality Date  . APPENDECTOMY  1968  . BACK SURGERY  2012  . COLONOSCOPY  2008'; 05/20/2012   2008; adenomatous polyps.  2013 Tubular adenoma:--recall 5 yrs.    Diverticulosis - Dr. Kellie Moor  . HERNIA REPAIR  1977   L inguinal  . KNEE ARTHROPLASTY Left 09/15/2017   Procedure: LEFT TOTAL KNEE ARTHROPLASTY WITH COMPUTER NAVIGATION;  Surgeon: Rod Can, MD;  Location: [facility];  Service: Orthopedics;  Laterality: Left;  Needs RNFA  . KNEE ARTHROSCOPY     Bilateral (L x 1, R x 1)  . TONSILLECTOMY AND ADENOIDECTOMY  1985  . TOTAL SHOULDER REPLACEMENT     Left   . TOTAL SHOULDER REPLACEMENT Right 12/10/2017   Done at [facility] in [place], [place].    Outpatient Medications Prior to Visit  Medication Sig Dispense Refill  . celecoxib (CELEBREX) 200 MG capsule      No facility-administered medications prior to visit.     No Known Allergies  ROS As per HPI  PE:  Blood pressure 128/88, pulse 64, temperature 98.3 F (36.8 C), temperature source Temporal, resp. rate 16, height 6' (1.829 m), weight 245 lb (111.1 kg), SpO2 95 %. Gen: Alert, well appearing.  Patient is oriented to person, place, time, and situation. AFFECT: pleasant, lucid thought and speech. [id number]: no injection, icteris, swelling, or exudate.  EOMI, PERRLA. Mouth: lips without lesion/swelling.  Oral mucosa pink and moist. Oropharynx without erythema, exudate, or swelling.  CV: RRR, no m/r/g.   LUNGS: CTA bilat, nonlabored resps, good aeration in all  lung fields. ABD: soft, mild tenderness to palpation mainly over/around umbilicus and in lower abd diffusely. Also a bit of discomfort to palpation in mid epigastrum.  No guarding or rebound., ND, BS normal.  No hepatospenomegaly or mass.  No bruits. EXT: no clubbing or cyanosis.  no edema.  Skin: NO PALLOR, RASH, OR JAUNDICE.  LABS:    Chemistry      Component Value Date/Time   NA 141 12/04/2017 1608   NA 145 02/13/2012   K 4.4 12/04/2017 1608   CL 103 12/04/2017 1608   CO2 30 12/04/2017 1608   BUN 19 12/04/2017 1608   BUN 14 02/13/2012   CREATININE 0.86 12/04/2017 1608   GLU 83 02/13/2012      Component Value Date/Time   CALCIUM 9.3 12/04/2017 1608   ALKPHOS 83 12/04/2017 1608   AST 20 12/04/2017 1608   ALT 24 12/04/2017 1608   BILITOT 0.4 12/04/2017 1608     Lab Results  Component Value Date   WBC 9.3 12/04/2017   HGB 14.3 12/04/2017   HCT 43.2 12/04/2017   MCV 90.5 12/04/2017   PLT 195.0 12/04/2017   Pertinent imaging: 11/17/17 CT abd/pelv with contrast:  CLINICAL DATA:  Mid and lower abdominal pain for 5 months, LEFT lower quadrant and LEFT groin pain, prior history of LEFT inguinal hernia repair  EXAM: CT ABDOMEN AND PELVIS WITH CONTRAST  TECHNIQUE: Multidetector CT imaging of the abdomen and pelvis was performed using the standard protocol following bolus administration of intravenous contrast. Sagittal and coronal MPR images reconstructed from axial data set.  Creatinine was obtained on site at [facility] at [address]  Results: Creatinine 0.8 mg/dL.  CONTRAST:  Dilute oral contrast.  100 cc Isovue-300 IV.  COMPARISON:  None  FINDINGS: Lower chest: Lung bases clear  Hepatobiliary: Gallbladder and liver normal appearance  Pancreas: Normal appearance  Spleen: Normal appearance  Adrenals/Urinary Tract: Adrenal glands, kidneys, ureters, and bladder normal appearance  Stomach/Bowel: Appendix surgically absent by  history. Minimal sigmoid diverticulosis without evidence of diverticulitis. Bowel loops unremarkable. Distal esophagus normal appearance. Diverticulum arising from the posterior aspect of the gastric fundus containing retained GI contrast and fluid.  Vascular/Lymphatic: Few pelvic phleboliths. Atherosclerotic calcification aorta without aneurysm. No adenopathy.  Reproductive: Unremarkable prostate gland and seminal vesicles  Other: BILATERAL inguinal hernias, moderate size on LEFT and small on RIGHT, both containing fat. No bowel herniation.  Musculoskeletal: No acute osseous findings.  IMPRESSION: BILATERAL inguinal hernias containing fat, moderate size on LEFT and small on RIGHT.  Gastric diverticulum at posterior fundus.  Aortic Atherosclerosis (ICD10-I70.0).  IMPRESSION AND PLAN:  Generalized abdominal pain: described more as a nagging "tightness". Hx and exam not suggestive of any ominous diagnosis at this time. Post-op adhesions pain?  Abd wall pain? Decided to check CBC w/diff, CMET, stop celebrex and advil, and start pantoprazole 40mg  qd. Signs/symptoms to call or return for were reviewed and pt expressed understanding.  An After Visit  Summary was printed and given to the patient.  FOLLOW UP: Return if symptoms worsen or fail to improve.  Signed:  Crissie Sickles, MD           07/07/2019  Signed:  Crissie Sickles, MD           07/07/2019

## 2019-07-07 NOTE — Patient Instructions (Signed)
Do not take your celecoxib (celebrex) or advil anymore. Take tylenol 1000 mg twice daily as needed for pain.

## 2019-07-16 ENCOUNTER — Telehealth: Payer: Self-pay | Admitting: Family Medicine

## 2019-07-16 DIAGNOSIS — R1084 Generalized abdominal pain: Secondary | ICD-10-CM

## 2019-07-16 NOTE — Telephone Encounter (Signed)
Wife concerned about husband pain levels.  What to do next?  Please call patient [phone]

## 2019-07-18 NOTE — Telephone Encounter (Signed)
CT abd/pevis ordered.

## 2019-07-19 NOTE — Telephone Encounter (Signed)
Left detailed message ( okay per DPR ) that next step is CT and front desk will approve with insurance, schedule and then call patient with appointment.

## 2019-07-20 ENCOUNTER — Ambulatory Visit
Admission: RE | Admit: 2019-07-20 | Discharge: 2019-07-20 | Disposition: A | Discharge status: Home / Self Care | Payer: No Typology Code available for payment source | Source: Ambulatory Visit | Attending: Family Medicine | Admitting: Family Medicine

## 2019-07-20 DIAGNOSIS — R1084 Generalized abdominal pain: Secondary | ICD-10-CM

## 2019-07-20 MED ORDER — IOPAMIDOL (ISOVUE-300) INJECTION 61%
100.0000 mL | Freq: Once | INTRAVENOUS | Status: AC | PRN
  Administered 2019-07-20: 100 mL via INTRAVENOUS

## 2019-07-21 ENCOUNTER — Encounter: Payer: Self-pay | Admitting: Family Medicine

## 2019-09-08 ENCOUNTER — Telehealth: Payer: Self-pay | Admitting: Gastroenterology

## 2019-09-10 ENCOUNTER — Encounter: Payer: Self-pay | Admitting: Gastroenterology

## 2019-09-23 DIAGNOSIS — Z86010 Personal history of colonic polyps: Secondary | ICD-10-CM | POA: Insufficient documentation

## 2019-09-30 ENCOUNTER — Other Ambulatory Visit: Payer: Self-pay

## 2019-09-30 ENCOUNTER — Ambulatory Visit (AMBULATORY_SURGERY_CENTER): Payer: Self-pay

## 2019-09-30 VITALS — Temp 96.9°F | Ht 72.0 in | Wt 239.6 lb

## 2019-09-30 DIAGNOSIS — Z86010 Personal history of colonic polyps: Secondary | ICD-10-CM

## 2019-09-30 DIAGNOSIS — K760 Fatty (change of) liver, not elsewhere classified: Secondary | ICD-10-CM

## 2019-09-30 DIAGNOSIS — I700 Atherosclerosis of aorta: Secondary | ICD-10-CM

## 2019-09-30 MED ORDER — NA SULFATE-K SULFATE-MG SULF 17.5-3.13-1.6 GM/177ML PO SOLN: 1.0000 | Freq: Once | ORAL | 0 refills | Status: AC

## 2019-09-30 NOTE — Progress Notes (Signed)
Denies allergies to eggs or soy products. Denies complication of anesthesia or sedation. Denies use of weight loss medication. Denies use of O2.   [facility] instructions given for colonoscopy.   Covid screening is scheduled for 10/07/19 @ 9:50 Am. A 15.00 coupon for Suprep was given to the patient.

## 2019-10-04 ENCOUNTER — Telehealth: Payer: Self-pay | Admitting: Gastroenterology

## 2019-10-04 NOTE — Telephone Encounter (Signed)
Patient states he can afford the suprep $103 he just does not want to pay that because his daughter who is a pharmacist told him that he can do a generic. I explained that the Golytely prep that we use is on back order and we can not get this at this time. I offered Miralax but he decided not to switch at this time, he wants to talk with his daughter first. He will call us back if needed.

## 2019-10-05 ENCOUNTER — Telehealth: Payer: Self-pay | Admitting: *Deleted

## 2019-10-05 DIAGNOSIS — Z86010 Personal history of colonic polyps: Secondary | ICD-10-CM

## 2019-10-05 MED ORDER — PEG 3350-KCL-NA BICARB-NACL 420 G PO SOLR: 4000.0000 mL | Freq: Once | ORAL | 0 refills | Status: AC

## 2019-10-05 NOTE — Telephone Encounter (Signed)
Patient called back today requesting the Golytely prep. He states his daughter who is a Software engineer found him the Golytely prep. I did explain to him that he will have to drink all the prep and he must follow OUR instructions and he will need to purchase a box of Dulcolax laxatives tabs. Patient agrees to this and he states  He will drink all the prep. New instructions sent to pt's MyChart -pt is aware, and RX for the Golytely sent to his daughters pharmacy per the patient's request.

## 2019-10-07 ENCOUNTER — Other Ambulatory Visit: Payer: Self-pay | Admitting: Gastroenterology

## 2019-10-07 ENCOUNTER — Ambulatory Visit (INDEPENDENT_AMBULATORY_CARE_PROVIDER_SITE_OTHER): Payer: No Typology Code available for payment source

## 2019-10-07 DIAGNOSIS — Z1159 Encounter for screening for other viral diseases: Secondary | ICD-10-CM

## 2019-10-07 LAB — SARS CORONAVIRUS 2 (TAT 6-24 HRS): SARS Coronavirus 2: NEGATIVE

## 2019-10-12 ENCOUNTER — Encounter: Payer: Self-pay | Admitting: Gastroenterology

## 2019-10-12 ENCOUNTER — Ambulatory Visit (AMBULATORY_SURGERY_CENTER): Payer: No Typology Code available for payment source | Admitting: Gastroenterology

## 2019-10-12 ENCOUNTER — Other Ambulatory Visit: Payer: Self-pay

## 2019-10-12 VITALS — BP 136/84 | HR 62 | Temp 98.3°F | Resp 20 | Ht 72.0 in | Wt 239.6 lb

## 2019-10-12 DIAGNOSIS — D125 Benign neoplasm of sigmoid colon: Secondary | ICD-10-CM

## 2019-10-12 DIAGNOSIS — Z860100 Personal history of colon polyps, unspecified: Secondary | ICD-10-CM

## 2019-10-12 DIAGNOSIS — D123 Benign neoplasm of transverse colon: Secondary | ICD-10-CM

## 2019-10-12 DIAGNOSIS — D127 Benign neoplasm of rectosigmoid junction: Secondary | ICD-10-CM

## 2019-10-12 MED ORDER — SODIUM CHLORIDE 0.9 % IV SOLN: 500.0000 mL | Freq: Once | INTRAVENOUS | Status: DC

## 2019-10-12 NOTE — Patient Instructions (Signed)
  Handouts given:  Hemorrhoids, Diverticulosis, Polyps Continue current medications Resume previous diet  Await pathology      YOU HAD AN ENDOSCOPIC PROCEDURE TODAY AT [facility]:   Refer to the procedure report that was given to you for any specific questions about what was found during the examination.  If the procedure report does not answer your questions, please call your gastroenterologist to clarify.  If you requested that your care partner not be given the details of your procedure findings, then the procedure report has been included in a sealed envelope for you to review at your convenience later.  YOU SHOULD EXPECT: Some feelings of bloating in the abdomen. Passage of more gas than usual.  Walking can help get rid of the air that was put into your GI tract during the procedure and reduce the bloating. If you had a lower endoscopy (such as a colonoscopy or flexible sigmoidoscopy) you may notice spotting of blood in your stool or on the toilet paper. If you underwent a bowel prep for your procedure, you may not have a normal bowel movement for a few days.  Please Note:  You might notice some irritation and congestion in your nose or some drainage.  This is from the oxygen used during your procedure.  There is no need for concern and it should clear up in a day or so.  SYMPTOMS TO REPORT IMMEDIATELY:   Following lower endoscopy (colonoscopy or flexible sigmoidoscopy):  Excessive amounts of blood in the stool  Significant tenderness or worsening of abdominal pains  Swelling of the abdomen that is new, acute  Fever of 100F or higher   For urgent or emergent issues, a gastroenterologist can be reached at any hour by calling [phone].   DIET:  We do recommend a small meal at first, but then you may proceed to your regular diet.  Drink plenty of fluids but you should avoid alcoholic beverages for 24 hours.  ACTIVITY:  You should plan to take it easy for the  rest of today and you should NOT DRIVE or use heavy machinery until tomorrow (because of the sedation medicines used during the test).    FOLLOW UP: Our staff will call the number listed on your records 48-72 hours following your procedure to check on you and address any questions or concerns that you may have regarding the information given to you following your procedure. If we do not reach you, we will leave a message.  We will attempt to reach you two times.  During this call, we will ask if you have developed any symptoms of COVID 19. If you develop any symptoms (ie: fever, flu-like symptoms, shortness of breath, cough etc.) before then, please call [phone].  If you test positive for Covid 19 in the 2 weeks post procedure, please call and report this information to [place].    If any biopsies were taken you will be contacted by phone or by letter within the next 1-3 weeks.  Please call us at [phone] if you have not heard about the biopsies in 3 weeks.    SIGNATURES/CONFIDENTIALITY: You and/or your care partner have signed paperwork which will be entered into your electronic medical record.  These signatures attest to the fact that that the information above on your After Visit Summary has been reviewed and is understood.  Full responsibility of the confidentiality of this discharge information lies with you and/or your care-partner.

## 2019-10-12 NOTE — Op Note (Signed)
[place] Patient Name: Anthony Barnett Procedure Date: 10/12/2019 1:22 PM MRN: [id number] Endoscopist: Remo Lipps P. Havery Moros , MD Age: 63 Referring MD:  Date of Birth: 1956/04/10 Gender: Male Account #: [id number] Procedure:                Colonoscopy Indications:              Surveillance: Personal history of adenomatous                            polyps on last colonoscopy (2013) Medicines:                Monitored Anesthesia Care Procedure:                Pre-Anesthesia Assessment:                           - Prior to the procedure, a History and Physical                            was performed, and patient medications and                            allergies were reviewed. The patient's tolerance of                            previous anesthesia was also reviewed. The risks                            and benefits of the procedure and the sedation                            options and risks were discussed with the patient.                            All questions were answered, and informed consent                            was obtained. Prior Anticoagulants: The patient has                            taken no previous anticoagulant or antiplatelet                            agents. ASA Grade Assessment: I - A normal, healthy                            patient. After reviewing the risks and benefits,                            the patient was deemed in satisfactory condition to                            undergo the procedure.  After obtaining informed consent, the colonoscope                            was passed under direct vision. Throughout the                            procedure, the patient's blood pressure, pulse, and                            oxygen saturations were monitored continuously. The                            Colonoscope was introduced through the anus and                            advanced to the the cecum, identified by                             appendiceal orifice and ileocecal valve. The                            colonoscopy was performed without difficulty. The                            patient tolerated the procedure well. The quality                            of the bowel preparation was good. The ileocecal                            valve, appendiceal orifice, and rectum were                            photographed. Scope In: 1:32:01 PM Scope Out: 1:53:45 PM Scope Withdrawal Time: 0 hours 15 minutes 27 seconds  Total Procedure Duration: 0 hours 21 minutes 44 seconds  Findings:                 The perianal and digital rectal examinations were                            normal.                           A 3 to 4 mm polyp was found in the transverse                            colon. The polyp was sessile. The polyp was removed                            with a cold snare. Resection and retrieval were                            complete.  A 3 to 4 mm polyp was found in the sigmoid colon.                            The polyp was sessile. The polyp was removed with a                            cold snare. Resection and retrieval were complete.                           A 5 mm polyp was found in the recto-sigmoid colon.                            The polyp was sessile. The polyp was removed with a                            cold snare. Resection and retrieval were complete.                           Many small and large-mouthed diverticula were found                            in the entire colon, highest burden in left and                            right colon.                           Internal hemorrhoids were found during retroflexion.                           The exam was otherwise without abnormality. Complications:            No immediate complications. Estimated blood loss:                            Minimal. Estimated Blood Loss:     Estimated blood loss was  minimal. Impression:               - One 3 to 4 mm polyp in the transverse colon,                            removed with a cold snare. Resected and retrieved.                           - One 3 to 4 mm polyp in the sigmoid colon, removed                            with a cold snare. Resected and retrieved.                           - One 5 mm polyp at the recto-sigmoid colon,  removed with a cold snare. Resected and retrieved.                           - Diverticulosis in the entire examined colon, high                            burden in left and right colon.                           - Internal hemorrhoids.                           - The examination was otherwise normal. Recommendation:           - Patient has a contact number available for                            emergencies. The signs and symptoms of potential                            delayed complications were discussed with the                            patient. Return to normal activities tomorrow.                            Written discharge instructions were provided to the                            patient.                           - Resume previous diet.                           - Continue present medications.                           - Await pathology results. Remo Lipps P. Lehua Flores, MD 10/12/2019 1:59:01 PM This report has been signed electronically.

## 2019-10-12 NOTE — Progress Notes (Signed)
Called to room to assist during endoscopic procedure.  Patient ID and intended procedure confirmed with present staff. Received instructions for my participation in the procedure from the performing physician.  

## 2019-10-12 NOTE — Progress Notes (Signed)
Report given to PACU, vss 

## 2019-10-14 ENCOUNTER — Telehealth: Payer: Self-pay | Admitting: *Deleted

## 2019-10-14 NOTE — Telephone Encounter (Signed)
  Follow up Call-  Call back number 10/12/2019  Post procedure Call Back phone  # [phone]  Permission to leave phone message Yes  Some recent data might be hidden     Patient questions:  Do you have a fever, pain , or abdominal swelling? No. Pain Score  0 *  Have you tolerated food without any problems? Yes.    Have you been able to return to your normal activities? Yes.    Do you have any questions about your discharge instructions: Diet   No. Medications  No. Follow up visit  No.  Do you have questions or concerns about your Care? No.  Actions: * If pain score is 4 or above: No action needed, pain <4.  1. Have you developed a fever since your procedure? no  2.   Have you had an respiratory symptoms (SOB or cough) since your procedure? no 3.   Have you tested positive for COVID 19 since your procedure no  4.   Have you had any family members/close contacts diagnosed with the COVID 19 since your procedure?  no   If yes to any of these questions please route to Joylene John, RN and Alphonsa Gin, Therapist, sports.

## 2019-10-14 NOTE — Telephone Encounter (Signed)
No answer for post procedure call back. Left message for patient to call with questions or concerns. 

## 2019-10-20 ENCOUNTER — Telehealth: Payer: Self-pay | Admitting: Gastroenterology

## 2019-10-20 NOTE — Telephone Encounter (Signed)
Called patient back, and he got his Path results from his colonoscopy on MyChart. I let him know the Dr. has not had a chance to  review it and give his recommendations yet. That we would call him as soon as that was done. He was good with that

## 2019-11-19 IMAGING — CR DG KNEE 1-2V PORT*L*
2 series · 2 of 2 positions shown · non-contrast
Comparison: None.

CLINICAL DATA: Status post left knee arthroplasty

EXAM:
PORTABLE LEFT KNEE - 1-2 VIEW

[AP]
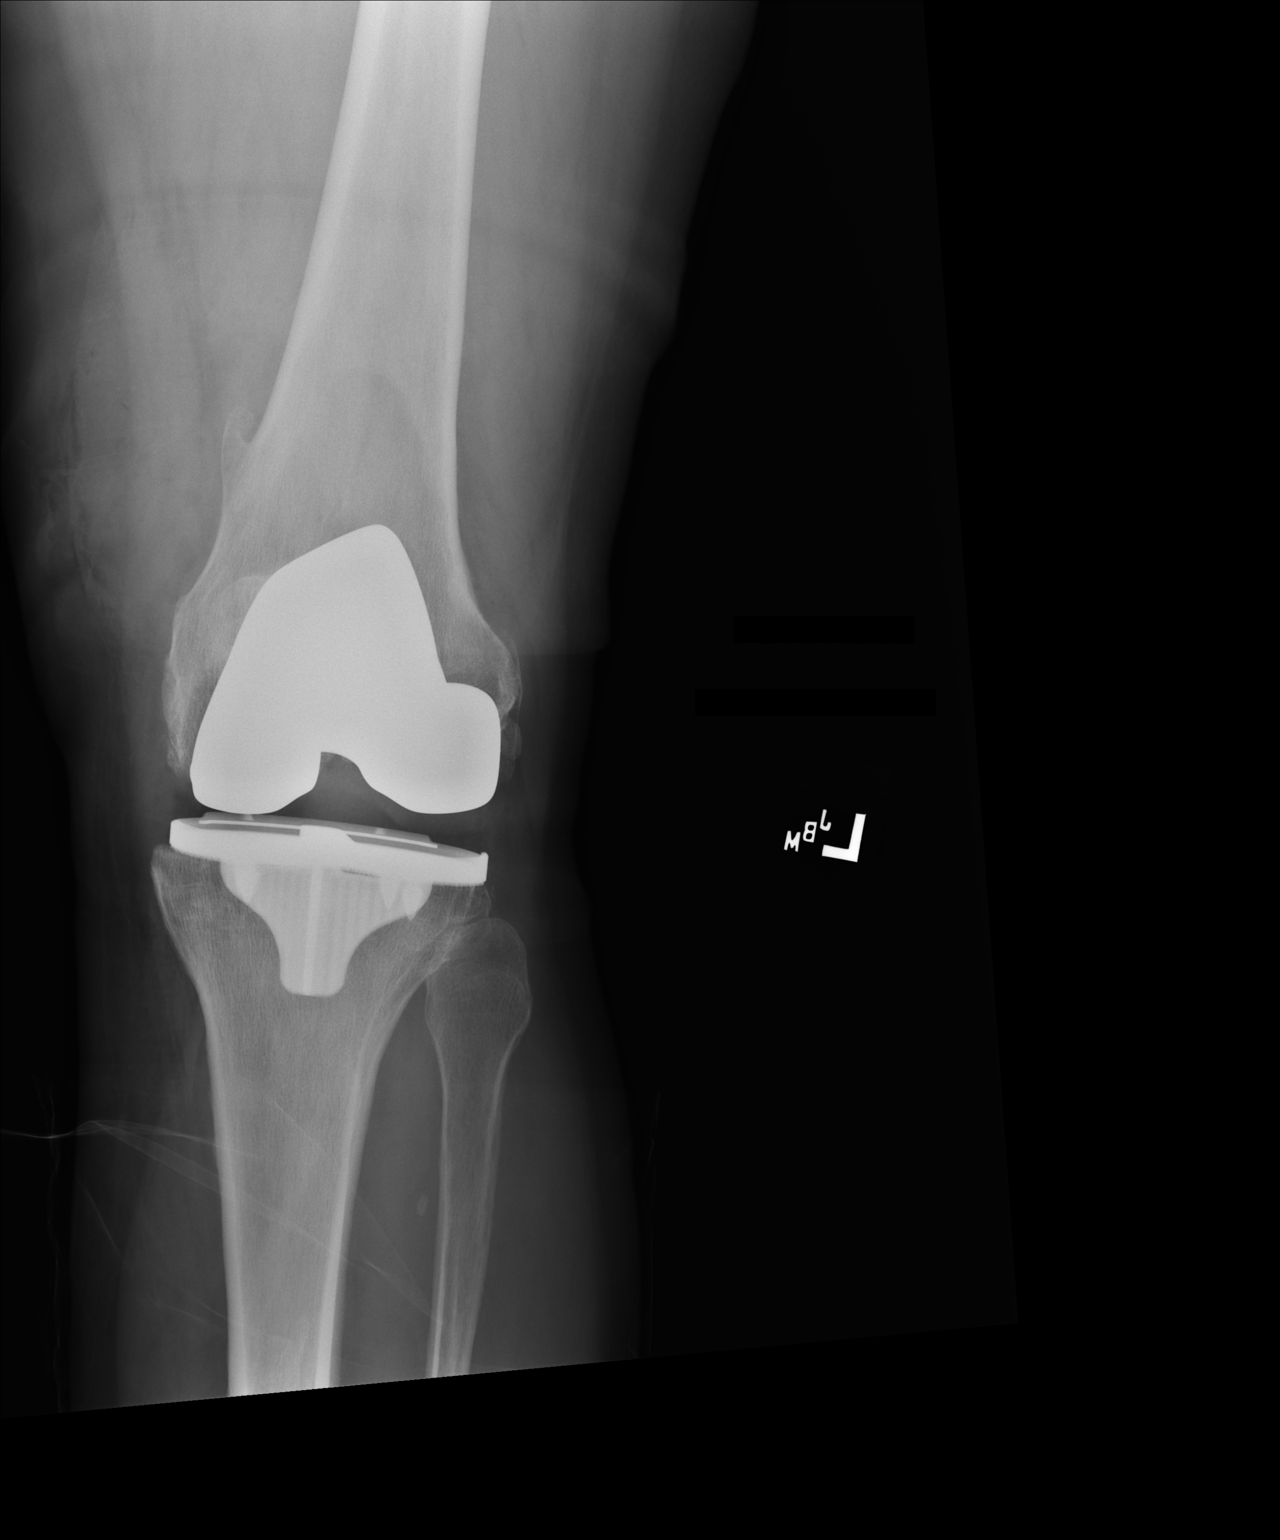

[xtable lateral]
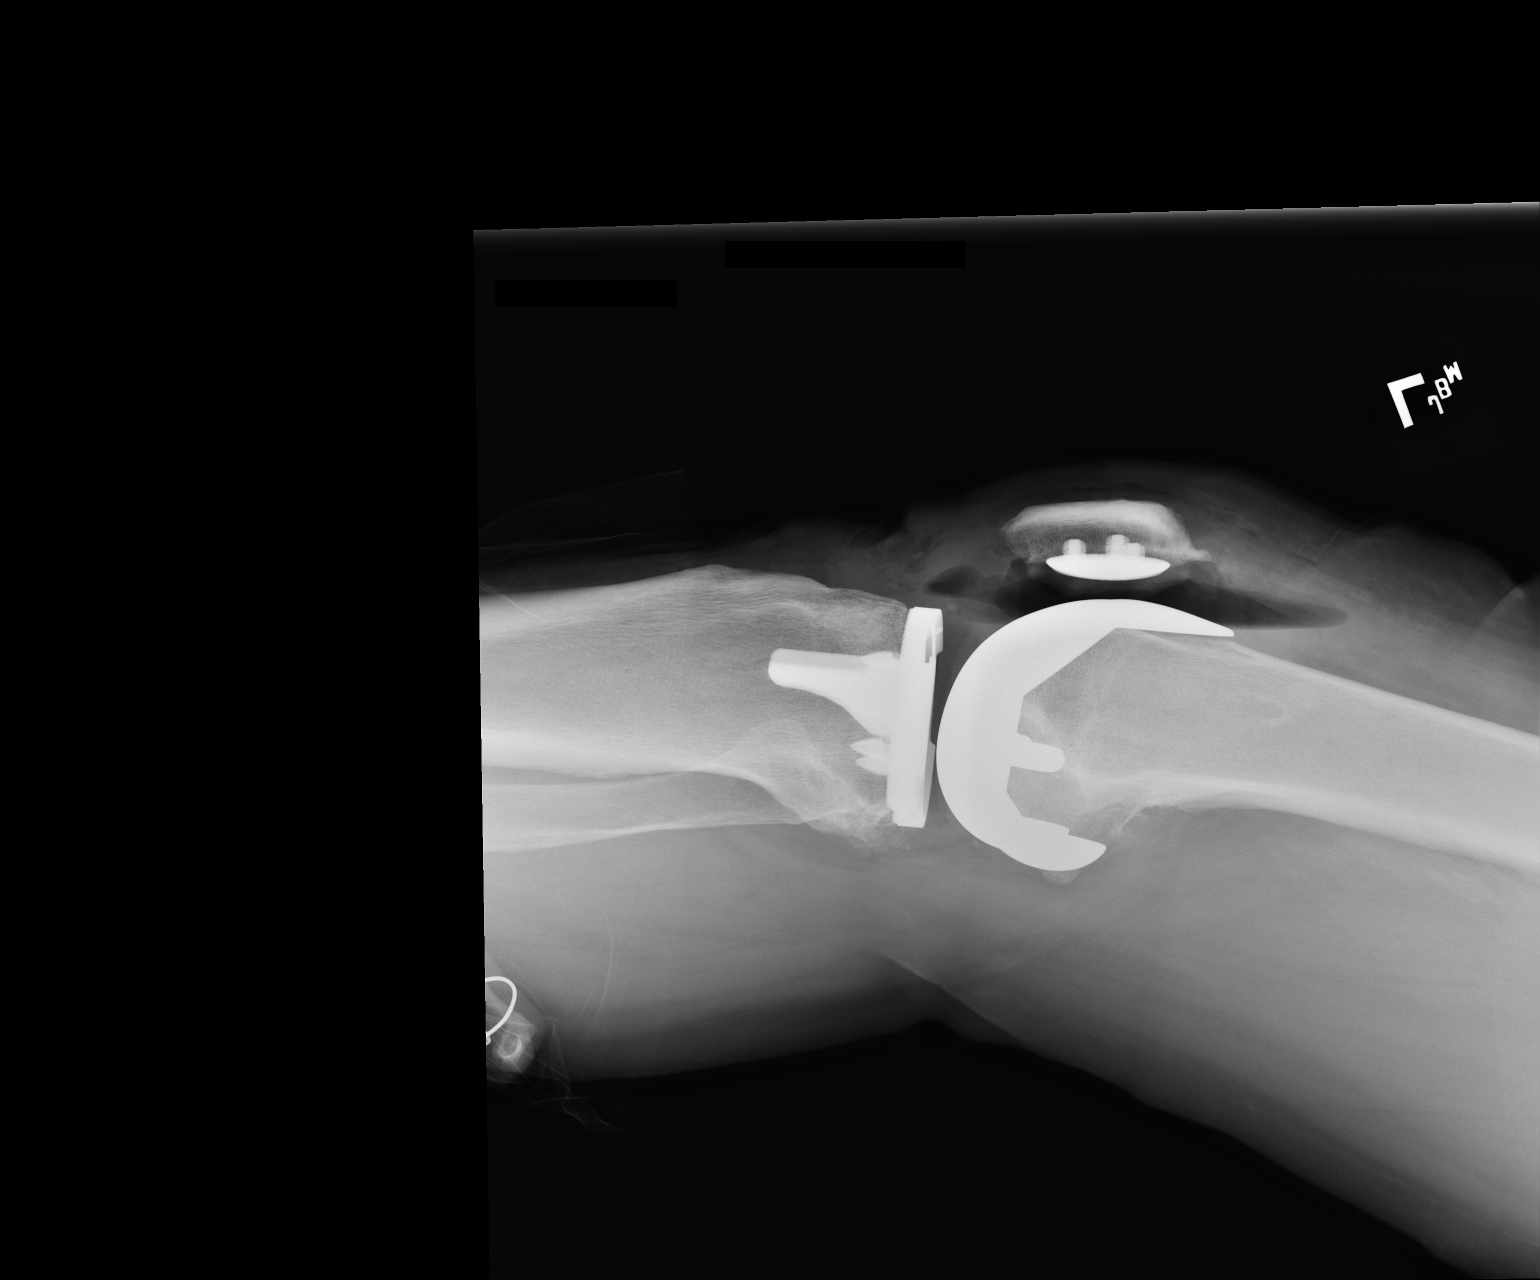

[2 of 2 positions shown; findings below may reference images not displayed]

FINDINGS: Status post left knee replacement with normal alignment. No
fracture. Gas in the soft tissues and joint space. Small
osteochondroma distal medial femoral cortex
IMPRESSION: 1. Status post left knee replacement with expected postsurgical
change
2. Small distal femoral osteochondroma

## 2019-11-22 ENCOUNTER — Encounter: Payer: Self-pay | Admitting: Family Medicine

## 2019-11-22 DIAGNOSIS — K5790 Diverticulosis of intestine, part unspecified, without perforation or abscess without bleeding: Secondary | ICD-10-CM

## 2019-11-22 DIAGNOSIS — R1084 Generalized abdominal pain: Secondary | ICD-10-CM

## 2019-11-22 DIAGNOSIS — R109 Unspecified abdominal pain: Secondary | ICD-10-CM

## 2019-11-22 NOTE — Telephone Encounter (Signed)
OK. GI referral ordered.

## 2019-11-22 NOTE — Telephone Encounter (Signed)
Please advise on referral

## 2019-12-22 ENCOUNTER — Encounter: Payer: Self-pay | Admitting: Gastroenterology

## 2019-12-22 ENCOUNTER — Other Ambulatory Visit: Payer: Self-pay

## 2019-12-22 ENCOUNTER — Ambulatory Visit (INDEPENDENT_AMBULATORY_CARE_PROVIDER_SITE_OTHER): Payer: No Typology Code available for payment source | Admitting: Gastroenterology

## 2019-12-22 VITALS — BP 142/82 | HR 70 | Temp 97.7°F | Ht 72.0 in | Wt 246.4 lb

## 2019-12-22 DIAGNOSIS — R109 Unspecified abdominal pain: Secondary | ICD-10-CM | POA: Diagnosis not present

## 2019-12-22 MED ORDER — GABAPENTIN 300 MG PO CAPS: ORAL_CAPSULE | ORAL | 1 refills | Status: DC

## 2019-12-22 NOTE — Patient Instructions (Addendum)
If you are age 63 or older, your body mass index should be between 23-30. Your Body mass index is 33.42 kg/m. If this is out of the aforementioned range listed, please consider follow up with your Primary Care Provider.  If you are age 64 or younger, your body mass index should be between 19-25. Your Body mass index is 33.42 kg/m. If this is out of the aformentioned range listed, please consider follow up with your Primary Care Provider.   We have sent the following medications to your pharmacy for you to pick up at your convenience: Gabapentin 300 mg: Take daily as bedtime for 1 week, then take twice a day  Please purchase the following medications over the counter and take as directed: Capsaicin cream  Call us in 2 weeks with an update.  Thank you for entrusting me with your care and for choosing [facility], Dr. [place] Cellar

## 2019-12-22 NOTE — Progress Notes (Signed)
HPI :  64 year old male here for a follow-up visit for abdominal pain.  I know him from recent colonoscopy performed in December 2020 for screening purposes.  He had a few small benign polyps removed, adenomatous and sessile serrated, recommended a 3-year follow-up colonoscopy at the time.  He states he has been having ongoing abdominal pain for the past 3 years or so.  He has had pain both at the umbilicus as well as his left inguinal region.  He has a prior evaluation by Dr. Rosendo Gros in 2019, CT scan at the time showed bilateral inguinal hernias which he had repaired, as well as periumbilical hernia.  He states this helped improve the pain at the time but it never resolved.  He has persistent discomfort in his periumbilical and left inguinal area which seems to be present all the time to some level.  Rated 2-3 out of 10 on a good day, can get as bad as 6-7 out of 10 on a day where it is more bothersome.  He does not have any triggers to this pain that he can identify.  There is no clear correlation to anything he is eating.  Eating does not precipitate the pain.  He has no positional correlation to the pain.  There is no improvement with having a bowel movement.  His bowels are regular for the most part without any blood.  No nausea no vomiting.  He is eating well.  Weight is stable.  He is very active and golfs frequently.  He states more active he is sometimes it tends to bother him more.  He had another CT scan done for this pain in September 2020.  They saw injury to have postoperative findings for the hernias, there was no other abnormality to account for his pain he incidentally is noted to have hepatic steatosis.  He denies any history of liver disease that is known.  His liver enzymes have been normal.  He drinks anywhere from [id number] alcoholic beverages in a week.. Weight has been stable in the 240s lbs.  He does have some chronic lumbar back pain as well.  Prior evaluation: Colonoscopy 10/12/19 -  The perianal and digital rectal examinations were normal. - A 3 to 4 mm polyp was found in the transverse colon. The polyp was sessile. The polyp was removed with a cold snare. Resection and retrieval were complete. - A 3 to 4 mm polyp was found in the sigmoid colon. The polyp was sessile. The polyp was removed with a cold snare. Resection and retrieval were complete. - A 5 mm polyp was found in the recto-sigmoid colon. The polyp was sessile. The polyp was removed with a cold snare. Resection and retrieval were complete. - Many small and large-mouthed diverticula were found in the entire colon, highest burden in left and right colon. - Internal hemorrhoids were found during retroflexion. - The exam was otherwise without abnormality.  CT scan 07/20/19 - IMPRESSION: 1. Interval postoperative findings of bilateral inguinal hernia repair. No evidence of recurrent hernia.  2. No acute CT findings of the abdomen or pelvis to explain mid epigastric pain.  3.  Hepatic steatosis.  4.  Aortic Atherosclerosis (ICD10-I70.0).  CT scan 11/17/2017 -  IMPRESSION: BILATERAL inguinal hernias containing fat, moderate size on LEFT and small on RIGHT.  Gastric diverticulum at posterior fundus.  Aortic Atherosclerosis (ICD10-I70.0).         Past Medical History:  Diagnosis Date  . Allergic rhinitis   . Aortic  atherosclerosis ([place]) 06/2019   Noted on CT abd/pelv done for abd pain  . Cervical spinal stenosis 2019   Chronic cervicalgia.  Failed conservative tx/PT-->[facility]: MRI multilevel DDD, w/multilevel foraminal stenosis, with disc extrusion at C6-7.-->referred to PT.-->got worse, so ESI done 05/2018--helped.  . DDD (degenerative disc disease), lumbar   . Dysthymia   . Epidermoid cyst of skin   . GAD (generalized anxiety disorder)   . Generalized abdominal pain    recurrent 2020/2021-->GI ref 11/22/19  . Hepatic steatosis 06/2019   Noted on CT abd/pelv  . Hx of adenomatous polyp  of colon 2008; 2013   Recall 05/2017 (Dr. Hervey Ard, [place], [place].  . Inguinal hernia recurrent bilateral 10/2017   CT abd/pelv.  Fat-containing.  +Symptomatic.  Referred to gen surg 11/17/17.  . Insomnia secondary to anxiety    stress mainly related to work  . Left hip pain    ortho 05/2018   . Obesity, Class I, BMI 30-34.9   . Osteoarthritis of both knees    TKA on left; 10/2017 ([place]).       Past Surgical History:  Procedure Laterality Date  . APPENDECTOMY  1968  . BACK SURGERY  2012  . COLONOSCOPY  2008'; 05/20/2012   2008; adenomatous polyps.  2013 Tubular adenoma:--recall 5 yrs.    Diverticulosis - Dr. Kellie Moor  . HERNIA REPAIR  1977   L inguinal  . KNEE ARTHROPLASTY Left 09/15/2017   Procedure: LEFT TOTAL KNEE ARTHROPLASTY WITH COMPUTER NAVIGATION;  Surgeon: Rod Can, MD;  Location: [facility];  Service: Orthopedics;  Laterality: Left;  Needs RNFA  . KNEE ARTHROSCOPY     Bilateral (L x 1, R x 1)  . TONSILLECTOMY AND ADENOIDECTOMY  1985  . TOTAL SHOULDER REPLACEMENT     Left   . TOTAL SHOULDER REPLACEMENT Right 12/10/2017   Done at [facility] in [place], [place].   Family History  Problem Relation Age of Onset  . Colon cancer Maternal Grandfather 82  . Esophageal cancer Neg Hx   . Rectal cancer Neg Hx   . Stomach cancer Neg Hx    Social History   Tobacco Use  . Smoking status: Never Smoker  . Smokeless tobacco: Never Used  Substance Use Topics  . Alcohol use: Yes    Alcohol/week: 3.0 standard drinks    Types: 3 Glasses of wine per week  . Drug use: No   No current outpatient medications on file.   No current facility-administered medications for this visit.   No Known Allergies   Review of Systems: All systems reviewed and negative except where noted in HPI.   Lab Results  Component Value Date   WBC 8.0 07/07/2019   HGB 14.2 07/07/2019   HCT 43.2 07/07/2019   MCV 94.4 07/07/2019   PLT 153.0  07/07/2019    Lab Results  Component Value Date   CREATININE 0.86 07/07/2019   BUN 21 07/07/2019   NA 142 07/07/2019   K 4.8 07/07/2019   CL 104 07/07/2019   CO2 30 07/07/2019    Lab Results  Component Value Date   ALT 23 07/07/2019   AST 20 07/07/2019   ALKPHOS 65 07/07/2019   BILITOT 0.6 07/07/2019     Physical Exam: BP (!) 142/82   Pulse 70   Temp 97.7 F (36.5 C)   Ht 6' (1.829 m)   Wt 246 lb 6.4 oz (111.8 kg)   BMI 33.42 kg/m  Constitutional: Pleasant,well-developed, male in no acute  distress. HEENT: Normocephalic and atraumatic. Conjunctivae are normal. No scleral icterus. Neck supple.  Cardiovascular: Normal rate, regular rhythm.  Pulmonary/chest: Effort normal and breath sounds normal. No wheezing, rales or rhonchi. Abdominal: Soft, nondistended, tenderness to periumbilical area and L inguinal area. midline diastasis recti noted. No obvious hernia. negative carnett There are no masses palpable.  Extremities: no edema Lymphadenopathy: No cervical adenopathy noted. Neurological: Alert and oriented to person place and time. Skin: Skin is warm and dry. No rashes noted. Psychiatric: Normal mood and affect. Behavior is normal.   ASSESSMENT AND PLAN: 64 year old male here for reassessment of the following issues:  Abdominal / inguinal pain - periumbilical and left inguinal region, as above it is there all the time and never goes away but fluctuates in severity without clear triggers.  Colonoscopy did not show any clear abnormality to account for this and neither did recent CT scan.  His labs are normal.  I discussed differential diagnosis with him.  He has chronic tenderness to the area, I suspect he may have neuropathic/musculoskeletal pain driving the process at this time.  We discussed what this is and treatment options.  I am can give him a trial of gabapentin 300 mg nightly for 1 week and then can increase to twice daily if tolerated and it helps.  He can also  consider trying capsaicin cream to the area to see if that helps.  If this does not help I asked him to contact me in a few weeks and we will discuss other options to include trials of other medications and potentially referral to a pain management center to with consideration for trigger point injections.  I reassured him I do not see any other concerning pathology at this time, there is no obvious recurrence of hernias at these areas based on CT imaging and exam today. He agreed with the plan  History of colon polyps - repeat colonoscopy in 3 years for surveillance .  I spent 30 minutes of time, including in depth chart review, independent review of results as outlined above, communicating results with the patient directly, face-to-face time with the patient, coordinating care, and ordering studies and medications as appropriate, and documenting this encounter.  [place] Cellar, MD [facility]

## 2020-01-07 ENCOUNTER — Telehealth: Payer: Self-pay

## 2020-01-07 ENCOUNTER — Other Ambulatory Visit: Payer: Self-pay

## 2020-01-07 MED ORDER — GABAPENTIN 300 MG PO CAPS: 300.0000 mg | ORAL_CAPSULE | Freq: Two times a day (BID) | ORAL | 1 refills | Status: AC

## 2020-01-07 NOTE — Progress Notes (Signed)
g

## 2020-01-07 NOTE — Telephone Encounter (Signed)
Sent refill  Of Gabapentin to patient's pharmacy. Called patient and gave Dr. Doyne Keel comments. He will give [place] an update in 1-2 months, or sooner if he develops side effects

## 2020-02-07 ENCOUNTER — Encounter: Payer: Self-pay | Admitting: Family Medicine

## 2021-09-22 IMAGING — CT CT ABD-PELV W/ CM
2 of 5 series · 12 of 46 positions shown, 14 images · IV contrast (iopamidol)
Comparison: 11/18/2017

CLINICAL DATA: Generalized mid abdominal pain, hernia surgery 10
months ago, history of appendectomy

EXAM:
CT ABDOMEN AND PELVIS WITH CONTRAST
TECHNIQUE: Multidetector CT imaging of the abdomen and pelvis was performed
using the standard protocol following bolus administration of
intravenous contrast.
CONTRAST:  100mL [id number] IOPAMIDOL (9KH3AL-9SS) INJECTION 61%,
additional oral enteric contrast

[Series 2: abd pelvis 5.00 br40 s3 axial · axial · 0.66mm/px · z∈[+1210,+1600]mm · 9 of 98 slices shown, 11 images]
[im 10/98  soft-tissue]
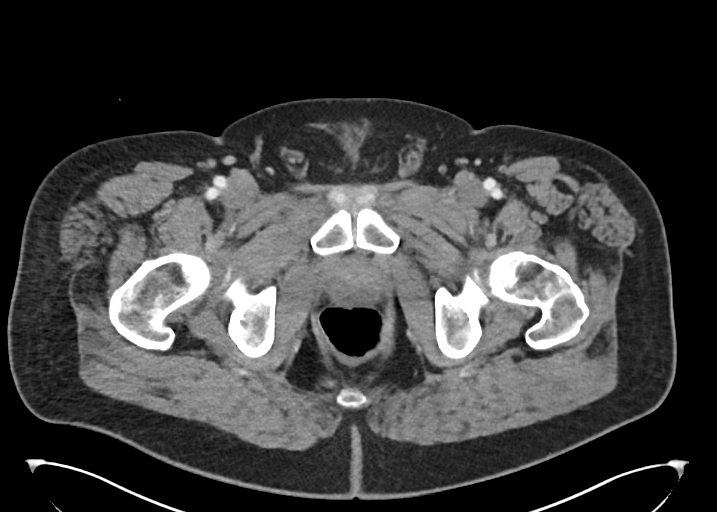
[im 10/98  bone]
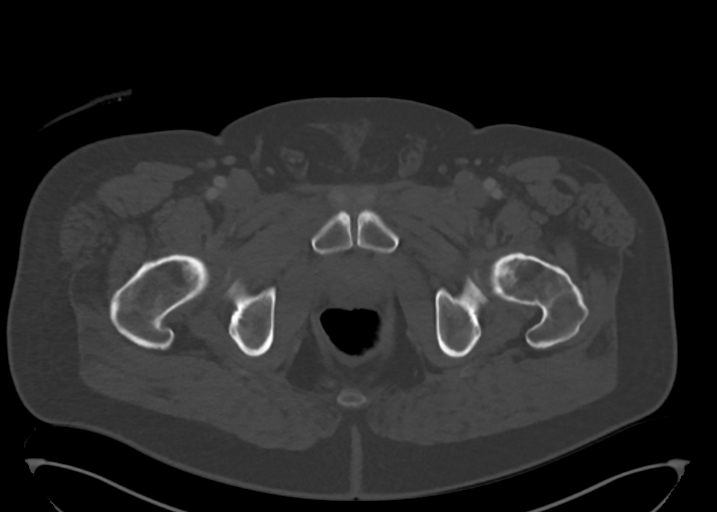
[im 19/98  soft-tissue]
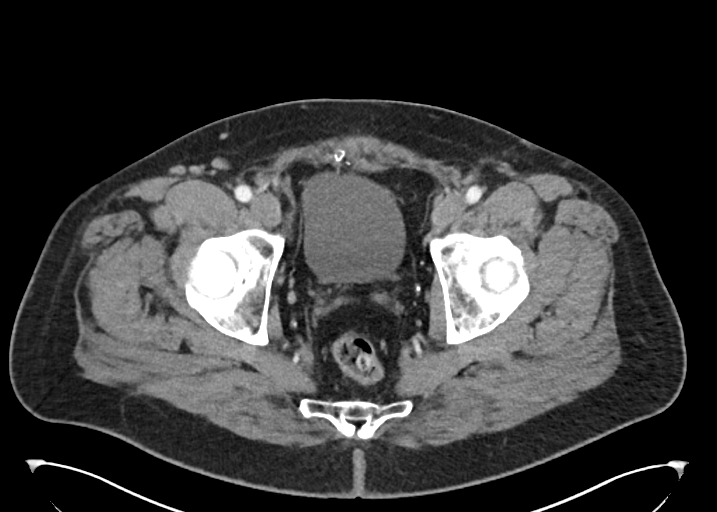
[im 28/98  soft-tissue]
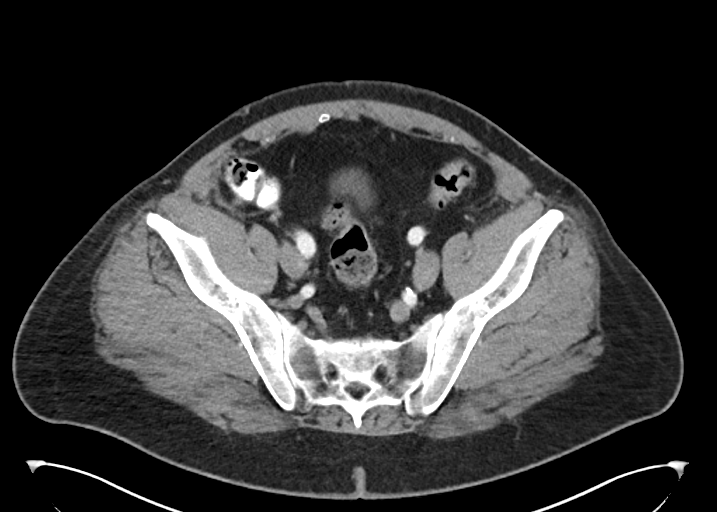
[im 37/98  soft-tissue]
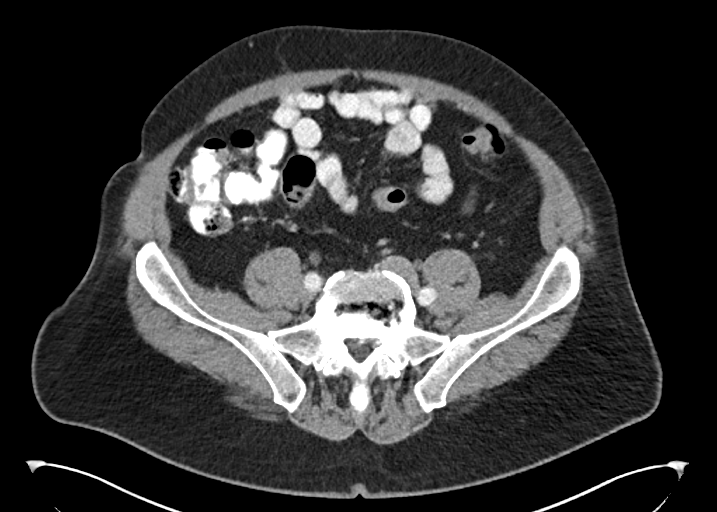
[im 51/98  soft-tissue]
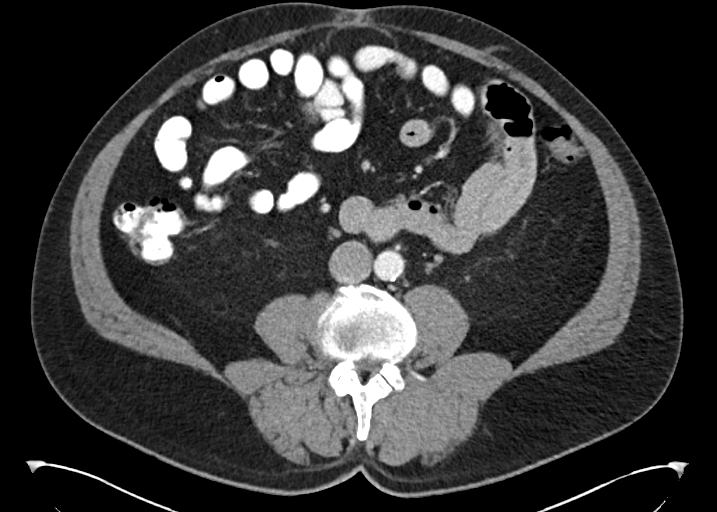
[im 61/98  soft-tissue]
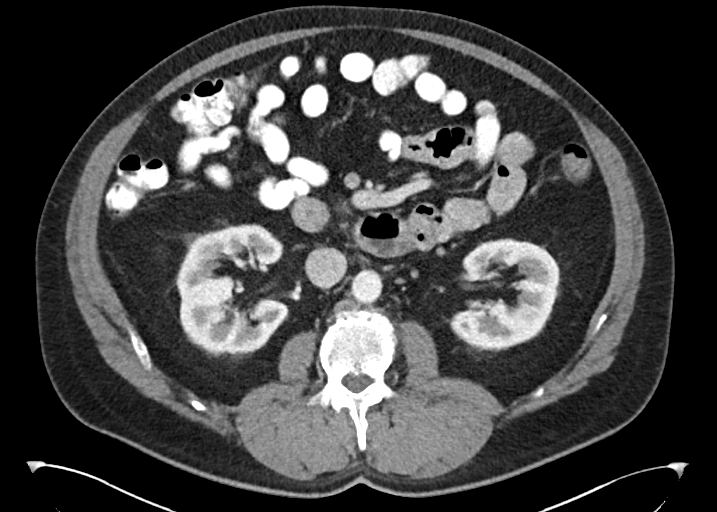
[im 70/98  soft-tissue]
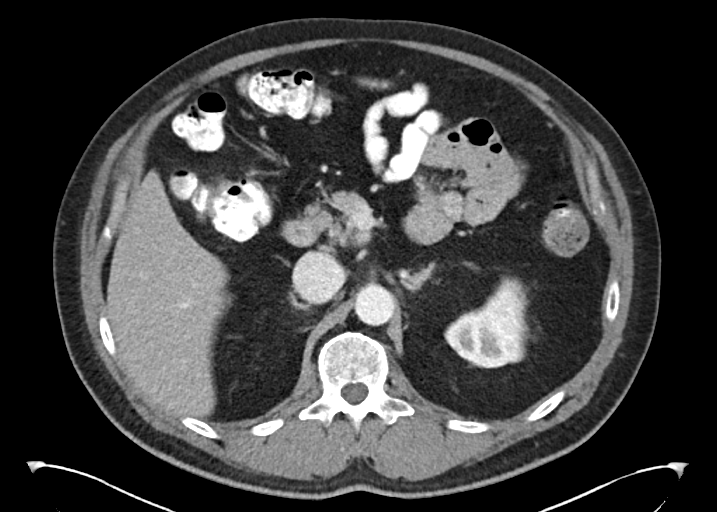
[im 79/98  soft-tissue]
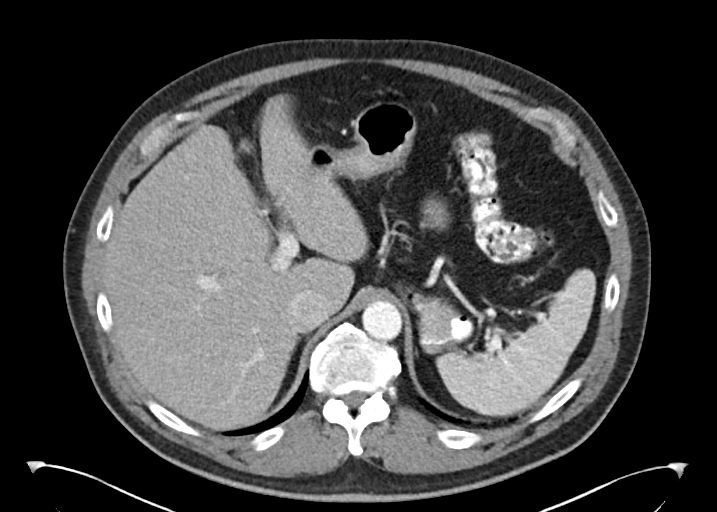
[im 88/98  soft-tissue]
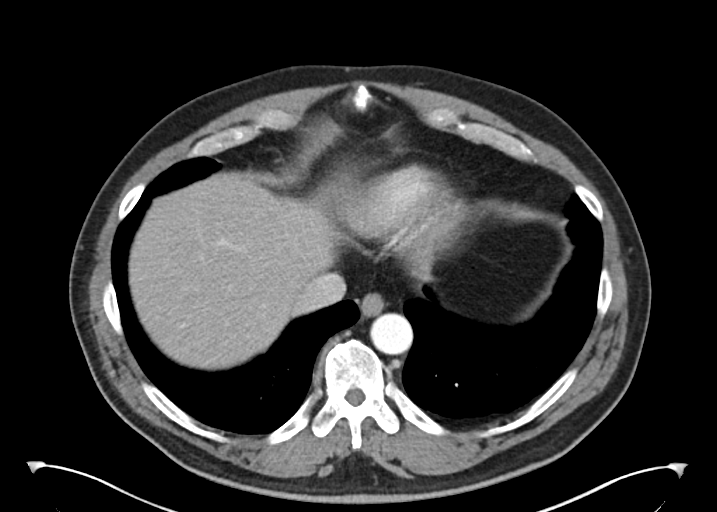
[im 88/98  bone]
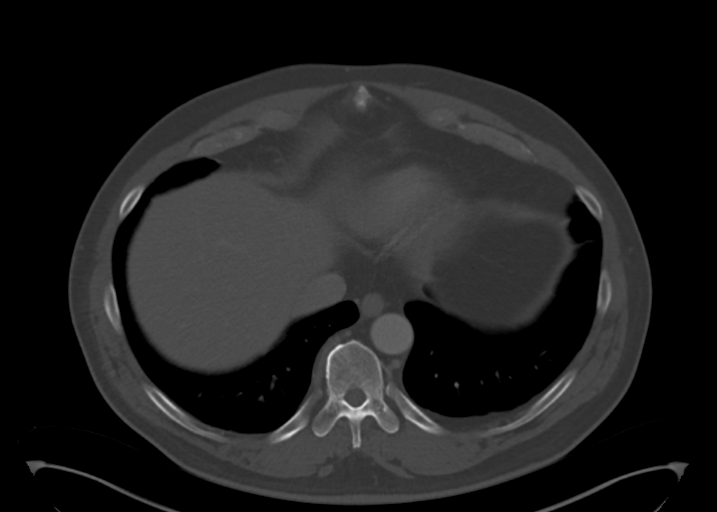

[Series 6: abd pelvis 2.00 br40 s3 cor · coronal · 0.89mm/px · 3 of 166 slices shown]
[im 56/166  soft-tissue]
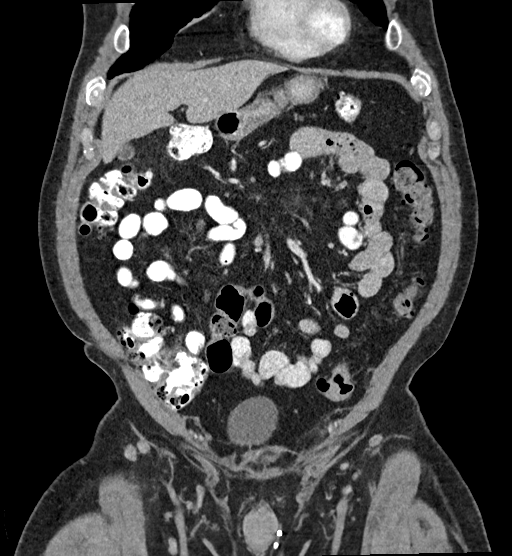
[im 74/166  soft-tissue]
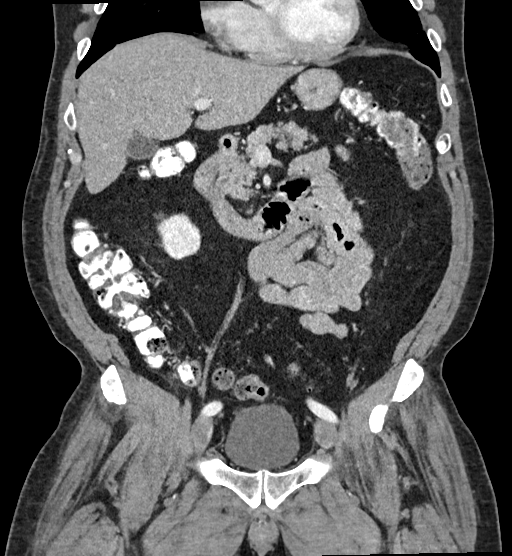
[im 92/166  soft-tissue]
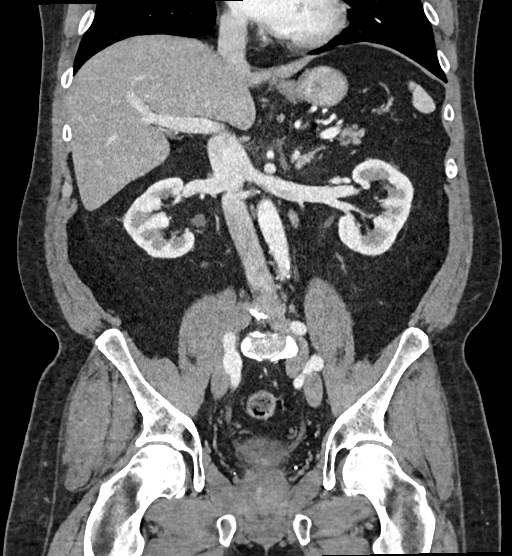

[12 of 46 positions shown; findings below may reference images not displayed]

FINDINGS: Lower chest: No acute abnormality.

Hepatobiliary: No solid liver abnormality is seen. Hepatic
steatosis. No gallstones, gallbladder wall thickening, or biliary
dilatation.

Pancreas: Unremarkable. No pancreatic ductal dilatation or
surrounding inflammatory changes.

Spleen: Normal in size without significant abnormality.

Adrenals/Urinary Tract: Adrenal glands are unremarkable. Kidneys are
normal, without renal calculi, solid lesion, or hydronephrosis.
Bladder is unremarkable.

Stomach/Bowel: Incidental diverticulum of the gastric fundus.
Appendix is surgically absent. No evidence of bowel wall thickening,
distention, or inflammatory changes. Sigmoid diverticulosis.

Vascular/Lymphatic: Aortic atherosclerosis. No enlarged abdominal or
pelvic lymph nodes.

Reproductive: No mass or other significant abnormality.

Other: Interval postoperative findings of bilateral inguinal hernia
repair. No abdominopelvic ascites.

Musculoskeletal: No acute or significant osseous findings.
IMPRESSION: 1. Interval postoperative findings of bilateral inguinal hernia
repair. No evidence of recurrent hernia.

2. No acute CT findings of the abdomen or pelvis to explain mid
epigastric pain.

3.  Hepatic steatosis.

4.  Aortic Atherosclerosis ([id number]).

## 2022-07-26 ENCOUNTER — Encounter: Payer: Self-pay | Admitting: Gastroenterology

## 2022-09-04 ENCOUNTER — Ambulatory Visit (AMBULATORY_SURGERY_CENTER): Payer: Self-pay

## 2022-09-04 VITALS — Ht 72.0 in | Wt 220.0 lb

## 2022-09-04 DIAGNOSIS — Z86010 Personal history of colonic polyps: Secondary | ICD-10-CM

## 2022-09-04 MED ORDER — NA SULFATE-K SULFATE-MG SULF 17.5-3.13-1.6 GM/177ML PO SOLN: 1.0000 | Freq: Once | ORAL | 0 refills | Status: AC

## 2022-09-04 NOTE — Progress Notes (Signed)

## 2022-09-10 ENCOUNTER — Telehealth: Payer: Self-pay | Admitting: Gastroenterology

## 2022-09-10 DIAGNOSIS — Z86010 Personal history of colonic polyps: Secondary | ICD-10-CM

## 2022-09-10 MED ORDER — NA SULFATE-K SULFATE-MG SULF 17.5-3.13-1.6 GM/177ML PO SOLN: 1.0000 | Freq: Once | ORAL | 0 refills | Status: AC

## 2022-09-10 NOTE — Telephone Encounter (Signed)
Inbound call from patient requesting that we send his Suprep prep to a different pharmacy. Patient is requesting it to [facility] to [facility] [place]. Please advise.   [phone]

## 2022-09-10 NOTE — Telephone Encounter (Signed)
Verified pharmacy with pt,[facility] [place] per request pt,aware and verbalized understanding.

## 2022-10-04 ENCOUNTER — Encounter: Payer: Self-pay | Admitting: Gastroenterology

## 2022-10-09 ENCOUNTER — Ambulatory Visit (AMBULATORY_SURGERY_CENTER): Payer: Medicare Other | Admitting: Gastroenterology

## 2022-10-09 ENCOUNTER — Encounter: Payer: Self-pay | Admitting: Gastroenterology

## 2022-10-09 VITALS — BP 110/74 | HR 67 | Temp 98.4°F | Resp 21 | Ht 72.0 in | Wt 219.0 lb

## 2022-10-09 DIAGNOSIS — Z09 Encounter for follow-up examination after completed treatment for conditions other than malignant neoplasm: Secondary | ICD-10-CM | POA: Diagnosis present

## 2022-10-09 DIAGNOSIS — Z86010 Personal history of colonic polyps: Secondary | ICD-10-CM | POA: Diagnosis not present

## 2022-10-09 MED ORDER — SODIUM CHLORIDE 0.9 % IV SOLN: 500.0000 mL | INTRAVENOUS | Status: DC

## 2022-10-09 NOTE — Progress Notes (Signed)
Pt's states no medical or surgical changes since previsit or office visit. 

## 2022-10-09 NOTE — Patient Instructions (Signed)
Please read handouts provided. Continue present medications. Resume previous diet.   YOU HAD AN ENDOSCOPIC PROCEDURE TODAY AT [facility]:   Refer to the procedure report that was given to you for any specific questions about what was found during the examination.  If the procedure report does not answer your questions, please call your gastroenterologist to clarify.  If you requested that your care partner not be given the details of your procedure findings, then the procedure report has been included in a sealed envelope for you to review at your convenience later.  YOU SHOULD EXPECT: Some feelings of bloating in the abdomen. Passage of more gas than usual.  Walking can help get rid of the air that was put into your GI tract during the procedure and reduce the bloating. If you had a lower endoscopy (such as a colonoscopy or flexible sigmoidoscopy) you may notice spotting of blood in your stool or on the toilet paper. If you underwent a bowel prep for your procedure, you may not have a normal bowel movement for a few days.  Please Note:  You might notice some irritation and congestion in your nose or some drainage.  This is from the oxygen used during your procedure.  There is no need for concern and it should clear up in a day or so.  SYMPTOMS TO REPORT IMMEDIATELY:  Following lower endoscopy (colonoscopy or flexible sigmoidoscopy):  Excessive amounts of blood in the stool  Significant tenderness or worsening of abdominal pains  Swelling of the abdomen that is new, acute  Fever of 100F or higher  For urgent or emergent issues, a gastroenterologist can be reached at any hour by calling [phone]. Do not use MyChart messaging for urgent concerns.    DIET:  We do recommend a small meal at first, but then you may proceed to your regular diet.  Drink plenty of fluids but you should avoid alcoholic beverages for 24 hours.  ACTIVITY:  You should plan to take it easy for the  rest of today and you should NOT DRIVE or use heavy machinery until tomorrow (because of the sedation medicines used during the test).    FOLLOW UP: Our staff will call the number listed on your records the next business day following your procedure.  We will call around 7:15- 8:00 am to check on you and address any questions or concerns that you may have regarding the information given to you following your procedure. If we do not reach you, we will leave a message.     If any biopsies were taken you will be contacted by phone or by letter within the next 1-3 weeks.  Please call us at [phone] if you have not heard about the biopsies in 3 weeks.    SIGNATURES/CONFIDENTIALITY: You and/or your care partner have signed paperwork which will be entered into your electronic medical record.  These signatures attest to the fact that that the information above on your After Visit Summary has been reviewed and is understood.  Full responsibility of the confidentiality of this discharge information lies with you and/or your care-partner.

## 2022-10-09 NOTE — Progress Notes (Signed)
Sedate, gd SR, tolerated procedure well, VSS, report to RN 

## 2022-10-09 NOTE — Progress Notes (Signed)
[facility] History and Physical   Primary Care Physician:  Joya Gaskins, FNP   Reason for Procedure:   History of colon polyps  Plan:    colonoscopy     HPI: Anthony Barnett is a 66 y.o. male  here for colonoscopy surveillance - last exam 09/2019 - 3 polyps - SSPs, TAs.  Patient denies any bowel symptoms at this time. No family history of colon cancer known. Otherwise feels well without any cardiopulmonary symptoms.   I have discussed risks / benefits of anesthesia and endoscopic procedure with Anthony Barnett and they wish to proceed with the exams as outlined today.    Colonoscopy 10/12/19 - The perianal and digital rectal examinations were normal. - A 3 to 4 mm polyp was found in the transverse colon. The polyp was sessile. The polyp was removed with a cold snare. Resection and retrieval were complete. - A 3 to 4 mm polyp was found in the sigmoid colon. The polyp was sessile. The polyp was removed with a cold snare. Resection and retrieval were complete. - A 5 mm polyp was found in the recto-sigmoid colon. The polyp was sessile. The polyp was removed with a cold snare. Resection and retrieval were complete. - Many small and large-mouthed diverticula were found in the entire colon, highest burden in left and right colon. - Internal hemorrhoids were found during retroflexion. - The exam was otherwise without abnormality.  Surgical [P], colon, sigmoid, transverse, rectosigmoid, polyp (3) - TUBULAR ADENOMA(S) WITHOUT HIGH-GRADE DYSPLASIA OR MALIGNANCY - SESSILE SERRATED POLYP WITHOUT CYTOLOGIC DYSPLASIA    Past Medical History:  Diagnosis Date   Allergic rhinitis    Aortic atherosclerosis ([place]) 06/2019   Noted on CT abd/pelv done for abd pain   Cervical spinal stenosis 2019   Chronic cervicalgia.  Failed conservative tx/PT-->[facility]: MRI multilevel DDD, w/multilevel foraminal stenosis, with disc extrusion at C6-7.-->referred to PT.-->got worse, so ESI  done 05/2018--helped.   DDD (degenerative disc disease), lumbar    Dysthymia    Epidermoid cyst of skin    GAD (generalized anxiety disorder)    Generalized abdominal pain    recurrent 2020/2021-->GI ref 11/22/19   Hepatic steatosis 06/2019   Noted on CT abd/pelv   Hx of adenomatous polyp of colon 2008; 2013;09/2019   2008 and 2013 Dr. Hervey Ard, [place], [place]. 2020 Dr. Golden Circle 09/2022.   Inguinal hernia recurrent bilateral 10/2017   CT abd/pelv.  Fat-containing.  +Symptomatic.  Referred to gen surg 11/17/17.   Insomnia secondary to anxiety    stress mainly related to work   Left hip pain    ortho 05/2018    Obesity, Class I, BMI 30-34.9    Osteoarthritis of both knees    TKA on left; 10/2017 ([place]).      Past Surgical History:  Procedure Laterality Date   APPENDECTOMY  1968   BACK SURGERY  2012   l4-5 l5 S1   COLONOSCOPY  2008'; 05/20/2012; 09/2019   2008; adenomatous polyps.  2013 Tubular adenoma:--recall 5 yrs.    Diverticulosis - Dr. Kellie Moor. 09/2019 adenomatous polyps->recall 3 yrs.   HERNIA REPAIR  1977   L inguinal   KNEE ARTHROPLASTY Left 09/15/2017   Procedure: LEFT TOTAL KNEE ARTHROPLASTY WITH COMPUTER NAVIGATION;  Surgeon: Rod Can, MD;  Location: [facility];  Service: Orthopedics;  Laterality: Left;  Needs RNFA   KNEE ARTHROSCOPY     Bilateral (L x 1, R x 1)   TONSILLECTOMY AND ADENOIDECTOMY  1985   TOTAL  SHOULDER REPLACEMENT     Left    TOTAL SHOULDER REPLACEMENT Right 12/10/2017   Done at [facility] in [place], [place].    Prior to Admission medications   Medication Sig Start Date End Date Taking? Authorizing Provider  gabapentin (NEURONTIN) 300 MG capsule Take 1 capsule (300 mg total) by mouth 2 (two) times daily. 01/07/20  Yes Kareen Jefferys, Carlota Raspberry, MD  meloxicam (MOBIC) 15 MG tablet Take 1 tablet by mouth daily. 09/04/22   [provider]    Current Outpatient Medications  Medication Sig  Dispense Refill   gabapentin (NEURONTIN) 300 MG capsule Take 1 capsule (300 mg total) by mouth 2 (two) times daily. 180 capsule 1   meloxicam (MOBIC) 15 MG tablet Take 1 tablet by mouth daily.     Current Facility-Administered Medications  Medication Dose Route Frequency Provider Last Rate Last Admin   0.9 %  sodium chloride infusion  500 mL Intravenous Continuous Petrice Beedy, Carlota Raspberry, MD        Allergies as of 10/09/2022   (No Known Allergies)    Family History  Problem Relation Age of Onset   Colon cancer Maternal Grandfather 85   Esophageal cancer Neg Hx    Rectal cancer Neg Hx    Stomach cancer Neg Hx     Social History   Socioeconomic History   Marital status: Married    Spouse name: Not on file   Number of children: Not on file   Years of education: Not on file   Highest education level: Not on file  Occupational History   Not on file  Tobacco Use   Smoking status: Never   Smokeless tobacco: Never  Vaping Use   Vaping Use: Never used  Substance and Sexual Activity   Alcohol use: Yes    Alcohol/week: 3.0 standard drinks of alcohol    Types: 3 Glasses of wine per week   Drug use: No   Sexual activity: Not on file  Other Topics Concern   Not on file  Social History Narrative   Married, 1 daughter and 1 son.   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   No tobacco.   Alc: occ wine.   Social Determinants of Health   Financial Resource Strain: Not on file  Food Insecurity: Not on file  Transportation Needs: Not on file  Physical Activity: Not on file  Stress: Not on file  Social Connections: Not on file  Intimate Partner Violence: Not on file    Review of Systems: All other review of systems negative except as mentioned in the HPI.  Physical Exam: Vital signs BP 129/80   Pulse 81   Temp 98.4 F (36.9 C)   Ht 6' (1.829 m)   Wt 219 lb (99.3 kg)   SpO2 100%   BMI 29.70 kg/m   General:   Alert,  Well-developed,  pleasant and cooperative in NAD Lungs:  Clear throughout to auscultation.   Heart:  Regular rate and rhythm Abdomen:  Soft, nontender and nondistended.   Neuro/Psych:  Alert and cooperative. Normal mood and affect. A and O x 3  Jolly Mango, MD [facility]

## 2022-10-09 NOTE — Op Note (Signed)
[facility] Patient Name: Marques Ericson Procedure Date: 10/09/2022 9:13 AM MRN: [id number] Endoscopist: Remo Lipps P. Havery Moros , MD, [id number] Age: 66 Referring MD:  Date of Birth: [birth year] Gender: Male Account #: [id number] Procedure:                Colonoscopy Indications:              High risk colon cancer surveillance: Personal                            history of colonic polyps - 3 polyps removed                            09/2019 (adenomas / SSPs), adenomas removed 2013 Medicines:                Monitored Anesthesia Care Procedure:                Pre-Anesthesia Assessment:                           - Prior to the procedure, a History and Physical                            was performed, and patient medications and                            allergies were reviewed. The patient's tolerance of                            previous anesthesia was also reviewed. The risks                            and benefits of the procedure and the sedation                            options and risks were discussed with the patient.                            All questions were answered, and informed consent                            was obtained. Prior Anticoagulants: The patient has                            taken no anticoagulant or antiplatelet agents. ASA                            Grade Assessment: II - A patient with mild systemic                            disease. After reviewing the risks and benefits,                            the patient was deemed in satisfactory condition to  undergo the procedure.                           After obtaining informed consent, the colonoscope                            was passed under direct vision. Throughout the                            procedure, the patient's blood pressure, pulse, and                            oxygen saturations were monitored continuously. The                            Olympus  [id number]-HQ190L [id number]) Colonoscope was                            introduced through the anus and advanced to the the                            cecum, identified by appendiceal orifice and                            ileocecal valve. The colonoscopy was performed                            without difficulty. The patient tolerated the                            procedure well. The quality of the bowel                            preparation was adequate. The ileocecal valve,                            appendiceal orifice, and rectum were photographed. Scope In: 9:27:52 AM Scope Out: 9:50:34 AM Scope Withdrawal Time: 0 hours 16 minutes 58 seconds  Total Procedure Duration: 0 hours 22 minutes 42 seconds  Findings:                 The perianal and digital rectal examinations were                            normal.                           Many medium-mouthed diverticula were found in the                            entire colon.                           Internal hemorrhoids were found during  retroflexion. The hemorrhoids were small.                           The exam was otherwise without abnormality. Complications:            No immediate complications. Estimated blood loss:                            None. Estimated Blood Loss:     Estimated blood loss: none. Impression:               - Diverticulosis in the entire examined colon.                           - Internal hemorrhoids.                           - The examination was otherwise normal.                           - No polyps. Recommendation:           - Patient has a contact number available for                            emergencies. The signs and symptoms of potential                            delayed complications were discussed with the                            patient. Return to normal activities tomorrow.                            Written discharge instructions were provided to the                             patient.                           - Resume previous diet.                           - Continue present medications.                           - Repeat colonoscopy in 7 years or so given history                            of multiple polyps removed in the past Cade Olberding P. Hildur Bayer, MD 10/09/2022 9:56:13 AM This report has been signed electronically.

## 2022-10-10 ENCOUNTER — Telehealth: Payer: Self-pay

## 2022-10-10 NOTE — Telephone Encounter (Signed)
  Follow up Call-     10/09/2022    8:30 AM  Call back number  Post procedure Call Back phone  # [phone]  Permission to leave phone message Yes     Patient questions:  Do you have a fever, pain , or abdominal swelling? No. Pain Score  0 *  Have you tolerated food without any problems? Yes.    Have you been able to return to your normal activities? Yes.    Do you have any questions about your discharge instructions: Diet   No. Medications  No. Follow up visit  No.  Do you have questions or concerns about your Care? No.  Actions: * If pain score is 4 or above: No action needed, pain <4.

## 2023-08-27 ENCOUNTER — Ambulatory Visit: Payer: Medicare Other | Attending: Neurology | Admitting: Physical Therapy

## 2023-08-27 ENCOUNTER — Other Ambulatory Visit: Payer: Self-pay

## 2023-08-27 DIAGNOSIS — R2689 Other abnormalities of gait and mobility: Secondary | ICD-10-CM | POA: Diagnosis present

## 2023-08-27 DIAGNOSIS — R2681 Unsteadiness on feet: Secondary | ICD-10-CM | POA: Insufficient documentation

## 2023-08-27 DIAGNOSIS — M6281 Muscle weakness (generalized): Secondary | ICD-10-CM | POA: Diagnosis present

## 2023-08-27 NOTE — Therapy (Unsigned)
OUTPATIENT PHYSICAL THERAPY NEURO EVALUATION   Patient Name: [facility] MRN: [id number] DOB:February 09, 1956, 67 y.o., male Today's Date: 08/28/2023   PCP: Trisha Mangle, FNP REFERRING PROVIDER: Tanna Furry, MD  END OF SESSION:  PT End of Session - 08/27/23 1449     Visit Number 1    Number of Visits 6    Date for PT Re-Evaluation 10/09/23    Authorization Type BC[employer]    PT Start Time 1447    PT Stop Time 1530    PT Time Calculation (min) 43 min             Past Medical History:  Diagnosis Date   Allergic rhinitis    Aortic atherosclerosis (HCC) 06/2019   Noted on CT abd/pelv done for abd pain   Cervical spinal stenosis 2019   Chronic cervicalgia.  Failed conservative tx/PT-->[facility]: MRI multilevel DDD, w/multilevel foraminal stenosis, with disc extrusion at C6-7.-->referred to PT.-->got worse, so ESI done 05/2018--helped.   DDD (degenerative disc disease), lumbar    Dysthymia    Epidermoid cyst of skin    GAD (generalized anxiety disorder)    Generalized abdominal pain    recurrent 2020/2021-->GI ref 11/22/19   Hepatic steatosis 06/2019   Noted on CT abd/pelv   Hx of adenomatous polyp of colon 2008; 2013;09/2019   2008 and 2013 Dr. Perfecto Kingdom, [facility] [place], [place]. 2020 Dr. Sabino Donovan 09/2022.   Inguinal hernia recurrent bilateral 10/2017   CT abd/pelv.  Fat-containing.  +Symptomatic.  Referred to gen surg 11/17/17.   Insomnia secondary to anxiety    stress mainly related to work   Left hip pain    ortho 05/2018    Obesity, Class I, BMI 30-34.9    Osteoarthritis of both knees    TKA on left; 10/2017 ([place]).     Past Surgical History:  Procedure Laterality Date   APPENDECTOMY  1968   BACK SURGERY  2012   l4-5 l5 S1   COLONOSCOPY  2008'; 05/20/2012; 09/2019   2008; adenomatous polyps.  2013 Tubular adenoma:--recall 5 yrs.    Diverticulosis - Dr. Norval Gable. 09/2019 adenomatous polyps->recall 3 yrs.   HERNIA  REPAIR  1977   L inguinal   KNEE ARTHROPLASTY Left 09/15/2017   Procedure: LEFT TOTAL KNEE ARTHROPLASTY WITH COMPUTER NAVIGATION;  Surgeon: Samson Frederic, MD;  Location: [facility];  Service: Orthopedics;  Laterality: Left;  Needs RNFA   KNEE ARTHROSCOPY     Bilateral (L x 1, R x 1)   TONSILLECTOMY AND ADENOIDECTOMY  1985   TOTAL SHOULDER REPLACEMENT     Left    TOTAL SHOULDER REPLACEMENT Right 12/10/2017   Done at [facility] in [place], [place].   Patient Active Problem List   Diagnosis Date Noted   Hx of adenomatous polyp of colon    Hepatic steatosis 06/2019   Aortic atherosclerosis (HCC) 06/2019   Inguinal hernia recurrent bilateral 10/2017   Osteoarthritis of left knee 09/15/2017   Lumbar radiculopathy 05/02/2011    ONSET DATE: 09/20/21  REFERRING DIAG: U93.235 (ICD-10-CM) - Left leg weakness  THERAPY DIAG:  Muscle weakness (generalized)  Other abnormalities of gait and mobility  Unsteadiness on feet  Rationale for Evaluation and Treatment: Rehabilitation  SUBJECTIVE:  SUBJECTIVE STATEMENT: Pt states he had an 8 level spinal fusion sugery T11-S2 on 09/20/21. Pt states pain was improved but had weakness. Went through PT and able to return to golfing; however, had continued weakness. Had nerve conduction test that demonstrated concern for ALS. Saw ALS specialist in April 2024 but did not think it was definitive. Pt went back in 6 months but nothing significant had occurred except 1 fall. Pt reports he is a little weaker but not enough to do another nerve conduction test. Has not ruled out ALS. Pt states he gets drop foot on L resulting in catching of his toe with walking and difficulty with stairs. Plays golf (Mondays and Thursdays) and walks regularly. Does get spasms at times. Will stub  his toe or his knee can buckle.  Pt accompanied by: self  PERTINENT HISTORY: L TKA, spinal fusion T11-S2, history of prior discectomy L1-2  PAIN:  Are you having pain? No  PRECAUTIONS: None  RED FLAGS: None   WEIGHT BEARING RESTRICTIONS: No  FALLS: Has patient fallen in last 6 months? Yes. Number of falls 1 - pt states his foot caught and tripped over his own foot  LIVING ENVIRONMENT: Lives with: lives with their spouse Lives in: House/apartment Stairs: Yes: Internal: 14 steps; on right going up and External: 6 steps; on right going up Has following equipment at home: None  PLOF: Independent  PATIENT GOALS: Improve strength if able  OBJECTIVE:  Note: Objective measures were completed at Evaluation unless otherwise noted.  DIAGNOSTIC FINDINGS: EMG/NCS/NMUS (01/07/2023): This is a complex, abnormal study. There is electrophysiologic evidence of a diffuse, chronic neurogenic process with active denervation and fasciculations affecting the lower motor neurons and/or their axons within the bilateral lumbosacral greater than left cervical segment. Given the diffuse nature of involvement, this may be consistent with the clinical diagnosis of motor neuron disease, such as amyotrophic lateral sclerosis (ALS). Given his clinical history and if only in the legs, this electrophysiologic picture could also be consistent with a polyradiculopathy with ongoing denervation, although this is less likely. There is no electrophysiologic evidence for a left ulnar mononeuropathy.  COGNITION: Overall cognitive status: Within functional limits for tasks assessed   SENSATION: WFL  COORDINATION: Heel to shin WNL  EDEMA:  None but noted L quad atrophy vs R  MUSCLE TONE: None  MUSCLE LENGTH: Did not assess  DTRs:  Did not assess  POSTURE: No Significant postural limitations  LOWER EXTREMITY ROM:     Active  Right Eval Left Eval  Hip flexion    Hip extension    Hip abduction    Hip  adduction    Hip internal rotation    Hip external rotation    Knee flexion    Knee extension    Ankle dorsiflexion    Ankle plantarflexion    Ankle inversion    Ankle eversion     (Blank rows = not tested)  LOWER EXTREMITY MMT:    MMT Right Eval Left Eval  Hip flexion 5 4-  Hip extension 5 3+  Hip abduction 4+ 3  Hip adduction    Hip internal rotation    Hip external rotation    Knee flexion 5 5  Knee extension 5 3+  Ankle dorsiflexion 5 3+  Ankle plantarflexion 5 4  Ankle inversion    Ankle eversion    (Blank rows = not tested)  BED MOBILITY:  Independent  TRANSFERS: Assistive device utilized: None  Sit to stand: Complete Independence Stand  to sit: Complete Independence Chair to chair: Complete Independence Floor:  Did not assess  STAIRS: Level of Assistance: Modified independence Stair Negotiation Technique: Step to Pattern with Bilateral Rails Number of Stairs: 4  Height of Stairs: 6"  Comments: increased UE support  GAIT: Gait pattern: step through pattern, decreased stance time- Left, and decreased ankle dorsiflexion- Left Distance walked: Into clinic Assistive device utilized: None Level of assistance: Complete Independence  FUNCTIONAL TESTS:  5 times sit to stand: 16.95 sec (R weight shift) SLS: 13.28 sec on R, 1.98 sec on L Berg Balance Test: 50/56 FGA: 22/30  [facility] PT Assessment - 08/28/23 0001       Standardized Balance Assessment   Standardized Balance Assessment Berg Balance Test      Berg Balance Test   Sit to Stand Able to stand without using hands and stabilize independently    Standing Unsupported Able to stand safely 2 minutes    Sitting with Back Unsupported but Feet Supported on Floor or Stool Able to sit safely and securely 2 minutes    Stand to Sit Sits safely with minimal use of hands    Transfers Able to transfer safely, minor use of hands    Standing Unsupported with Eyes Closed Able to stand 10 seconds safely    Standing  Unsupported with Feet Together Able to place feet together independently and stand 1 minute safely    From Standing, Reach Forward with Outstretched Arm Can reach confidently >25 cm (10")    From Standing Position, Pick up Object from Floor Able to pick up shoe safely and easily    From Standing Position, Turn to Look Behind Over each Shoulder Looks behind one side only/other side shows less weight shift    Turn 360 Degrees Able to turn 360 degrees safely one side only in 4 seconds or less    Standing Unsupported, Alternately Place Feet on Step/Stool Able to stand independently and safely and complete 8 steps in 20 seconds    Standing Unsupported, One Foot in Front Able to plae foot ahead of the other independently and hold 30 seconds    Standing on One Leg Tries to lift leg/unable to hold 3 seconds but remains standing independently    Total Score 50      Functional Gait  Assessment   Gait assessed  Yes    Gait Level Surface Walks 20 ft in less than 5.5 sec, no assistive devices, good speed, no evidence for imbalance, normal gait pattern, deviates no more than 6 in outside of the 12 in walkway width.    Change in Gait Speed Able to smoothly change walking speed without loss of balance or gait deviation. Deviate no more than 6 in outside of the 12 in walkway width.    Gait with Horizontal Head Turns Performs head turns smoothly with no change in gait. Deviates no more than 6 in outside 12 in walkway width    Gait with Vertical Head Turns Performs head turns with no change in gait. Deviates no more than 6 in outside 12 in walkway width.    Gait and Pivot Turn Pivot turns safely in greater than 3 sec and stops with no loss of balance, or pivot turns safely within 3 sec and stops with mild imbalance, requires small steps to catch balance.    Step Over Obstacle Is able to step over one shoe box (4.5 in total height) without changing gait speed. No evidence of imbalance.    Gait  with Narrow Base of  Support Ambulates 7-9 steps.    Gait with Eyes Closed Walks 20 ft, uses assistive device, slower speed, mild gait deviations, deviates 6-10 in outside 12 in walkway width. Ambulates 20 ft in less than 9 sec but greater than 7 sec.    Ambulating Backwards Walks 20 ft, slow speed, abnormal gait pattern, evidence for imbalance, deviates 10-15 in outside 12 in walkway width.    Steps Two feet to a stair, must use rail.    Total Score 22              PATIENT SURVEYS:  Tba -- consider PSFS or LEFS  TODAY'S TREATMENT:                                                                                                                              DATE: 08/28/23 See HEP below    PATIENT EDUCATION: Education details: Exam findings, POC, initial HEP Person educated: Patient Education method: Explanation, Demonstration, and Handouts Education comprehension: verbalized understanding, returned demonstration, and needs further education  HOME EXERCISE PROGRAM: Access Code: [email or web address] URL: [email or web address] Date: 08/28/2023 Prepared by: Vernon Prey April Kirstie Peri  Exercises - Seated Ankle Dorsiflexion with Resistance  - 1 x daily - 7 x weekly - 3 sets - 10 reps - 3 sec hold - Seated Knee Extension with Resistance  - 1 x daily - 7 x weekly - 3 sets - 10 reps - 3 sec hold - Single Leg Heel Raise  - 1 x daily - 7 x weekly - 3 sets - 10 reps - Standing Hip Abduction with Bent Knee  - 1 x daily - 7 x weekly - 2 sets - 10 reps - 3 sec hold  GOALS: Goals reviewed with patient? Yes  SHORT TERM GOALS: Target date: 09/18/2023   Pt will be ind with initial HEP Baseline: Goal status: INITIAL  2.  Pt will have improved 5x STS to </=13 sec to demo increasing functional LE strength Baseline:  Goal status: INITIAL  3.  PT will administer LEFS or PSFS to obtain baseline Baseline:  Goal status: INITIAL   LONG TERM GOALS: Target date: 10/09/2023   Pt will be ind with management and  progression of HEP Baseline:  Goal status: INITIAL  2.  Pt will have improved Berg Balance to >/=54/56 Baseline: 50 Goal status: INITIAL  3.  Pt will have improved FGA to >/=28/30 to demo low fall risk and MCID Baseline: 22 Goal status: INITIAL  4.  Pt will demo at least 4/5 with L LE MMT for improved stair/incline ascent/descent Baseline:  Goal status: INITIAL  5.  Pt will demo MCID on LEFS or PSFS Baseline:  Goal status: INITIAL    ASSESSMENT:  CLINICAL IMPRESSION: Patient is a 67 y.o. M who was seen today for physical therapy evaluation and treatment for ongoing L LE weakness. PMH significant for PLIF T11-S1. Pt remains highly active with golf and  walking; however, continues to have L LE weakness, decreased L LE endurance, with increased falls/near falls. Assessment significant for L>R LE weakness with noted atrophy leading to decreased L LE stability/balance affecting activities such as stair climbing and ambulating limiting participation in community and recreational activities. Pt remains highly functional but is wanting to perform trial of PT to see if strength can be improved in his L LE or if there remains other conditions at play that will limit progress -- pt's nerve conduction study was concerning for possible ALS which has not been completely ruled out. Pt will benefit from PT for strengthening and balance.   OBJECTIVE IMPAIRMENTS: Abnormal gait, decreased activity tolerance, decreased balance, decreased endurance, decreased mobility, difficulty walking, decreased ROM, decreased strength, increased muscle spasms, improper body mechanics, and postural dysfunction.   ACTIVITY LIMITATIONS: standing, squatting, stairs, transfers, and locomotion level  PARTICIPATION LIMITATIONS: community activity, yard work, and recreation  PERSONAL FACTORS: Age, Fitness, Past/current experiences, and Time since onset of injury/illness/exacerbation are also affecting patient's functional  outcome.   REHAB POTENTIAL: Good  CLINICAL DECISION MAKING: Evolving/moderate complexity  EVALUATION COMPLEXITY: Moderate  PLAN:  PT FREQUENCY: 1-2x/week  PT DURATION: 6 weeks  PLANNED INTERVENTIONS: 97164- PT Re-evaluation, 97110-Therapeutic exercises, 97530- Therapeutic activity, [id number]- Neuromuscular re-education, 97535- Self Care, [id number]- Manual therapy, [id number]- Gait training, [phone]- Orthotic Fit/training, [phone]- Aquatic Therapy, [phone]- Electrical stimulation (unattended), Patient/Family education, Balance training, Stair training, Taping, Dry Needling, Joint mobilization, Cryotherapy, and Moist heat  PLAN FOR NEXT SESSION: Assess response to HEP. Continue L LE strengthening and stability. Assess PSFS or LEFS.    Prajwal Fellner April Ma L Titiana Severa, PT 08/28/2023, 8:41 AM

## 2023-09-03 ENCOUNTER — Ambulatory Visit: Payer: Medicare Other | Admitting: Physical Therapy

## 2023-09-03 DIAGNOSIS — R2681 Unsteadiness on feet: Secondary | ICD-10-CM

## 2023-09-03 DIAGNOSIS — R2689 Other abnormalities of gait and mobility: Secondary | ICD-10-CM

## 2023-09-03 DIAGNOSIS — M6281 Muscle weakness (generalized): Secondary | ICD-10-CM | POA: Diagnosis not present

## 2023-09-03 NOTE — Therapy (Signed)
OUTPATIENT PHYSICAL THERAPY NEURO EVALUATION   Patient Name: Anthony Barnett MRN: [id number] DOB:[birth year], 67 y.o., male Today's Date: 09/03/2023   PCP: Trisha Mangle, FNP REFERRING PROVIDER: Tanna Furry, MD  END OF SESSION:  PT End of Session - 09/03/23 1533     Visit Number 2    Number of Visits 6    Date for PT Re-Evaluation 10/09/23    Authorization Type BCBS    PT Start Time 1533    PT Stop Time 1615    PT Time Calculation (min) 42 min             Past Medical History:  Diagnosis Date   Allergic rhinitis    Aortic atherosclerosis (HCC) 06/2019   Noted on CT abd/pelv done for abd pain   Cervical spinal stenosis 2019   Chronic cervicalgia.  Failed conservative tx/PT-->[facility]: MRI multilevel DDD, w/multilevel foraminal stenosis, with disc extrusion at C6-7.-->referred to PT.-->got worse, so ESI done 05/2018--helped.   DDD (degenerative disc disease), lumbar    Dysthymia    Epidermoid cyst of skin    GAD (generalized anxiety disorder)    Generalized abdominal pain    recurrent 2020/2021-->GI ref 11/22/19   Hepatic steatosis 06/2019   Noted on CT abd/pelv   Hx of adenomatous polyp of colon 2008; 2013;09/2019   2008 and 2013 Dr. Perfecto Kingdom, [facility] [place], [place]. 2020 Dr. Sabino Donovan 09/2022.   Inguinal hernia recurrent bilateral 10/2017   CT abd/pelv.  Fat-containing.  +Symptomatic.  Referred to gen surg 11/17/17.   Insomnia secondary to anxiety    stress mainly related to work   Left hip pain    ortho 05/2018    Obesity, Class I, BMI 30-34.9    Osteoarthritis of both knees    TKA on left; 10/2017 ([facility]).     Past Surgical History:  Procedure Laterality Date   APPENDECTOMY  1968   BACK SURGERY  2012   l4-5 l5 S1   COLONOSCOPY  2008'; 05/20/2012; 09/2019   2008; adenomatous polyps.  2013 Tubular adenoma:--recall 5 yrs.    Diverticulosis - Dr. Norval Gable. 09/2019 adenomatous polyps->recall 3 yrs.    HERNIA REPAIR  1977   L inguinal   KNEE ARTHROPLASTY Left 09/15/2017   Procedure: LEFT TOTAL KNEE ARTHROPLASTY WITH COMPUTER NAVIGATION;  Surgeon: Samson Frederic, MD;  Location: [facility] OR;  Service: Orthopedics;  Laterality: Left;  Needs RNFA   KNEE ARTHROSCOPY     Bilateral (L x 1, R x 1)   TONSILLECTOMY AND ADENOIDECTOMY  1985   TOTAL SHOULDER REPLACEMENT     Left    TOTAL SHOULDER REPLACEMENT Right 12/10/2017   Done at [facility] in [place], [place].   Patient Active Problem List   Diagnosis Date Noted   Hx of adenomatous polyp of colon    Hepatic steatosis 06/2019   Aortic atherosclerosis (HCC) 06/2019   Inguinal hernia recurrent bilateral 10/2017   Osteoarthritis of left knee 09/15/2017   Lumbar radiculopathy 05/02/2011    ONSET DATE: 09/20/21  REFERRING DIAG: N82.956 (ICD-10-CM) - Left leg weakness  THERAPY DIAG:  Muscle weakness (generalized)  Other abnormalities of gait and mobility  Unsteadiness on feet  Rationale for Evaluation and Treatment: Rehabilitation  SUBJECTIVE:  SUBJECTIVE STATEMENT: Pt reports he has been doing his exercises. Has found them challenged.   From eval: Pt states he had an 8 level spinal fusion sugery T11-S2 on 09/20/21. Pt states pain was improved but had weakness. Went through PT and able to return to golfing; however, had continued weakness. Had nerve conduction test that demonstrated concern for ALS. Saw ALS specialist in April 2024 but did not think it was definitive. Pt went back in 6 months but nothing significant had occurred except 1 fall. Pt reports he is a little weaker but not enough to do another nerve conduction test. Has not ruled out ALS. Pt states he gets drop foot on L resulting in catching of his toe with walking and difficulty with stairs.  Plays golf (Mondays and Thursdays) and walks regularly. Does get spasms at times. Will stub his toe or his knee can buckle.  Pt accompanied by: self  PERTINENT HISTORY: L TKA, spinal fusion T11-S2, history of prior discectomy L1-2  PAIN:  Are you having pain? No  PRECAUTIONS: None  RED FLAGS: None   WEIGHT BEARING RESTRICTIONS: No  FALLS: Has patient fallen in last 6 months? Yes. Number of falls 1 - pt states his foot caught and tripped over his own foot  LIVING ENVIRONMENT: Lives with: lives with their spouse Lives in: House/apartment Stairs: Yes: Internal: 14 steps; on right going up and External: 6 steps; on right going up Has following equipment at home: None  PLOF: Independent  PATIENT GOALS: Improve strength if able  OBJECTIVE:  Note: Objective measures were completed at Evaluation unless otherwise noted.  DIAGNOSTIC FINDINGS: EMG/NCS/NMUS (01/07/2023): This is a complex, abnormal study. There is electrophysiologic evidence of a diffuse, chronic neurogenic process with active denervation and fasciculations affecting the lower motor neurons and/or their axons within the bilateral lumbosacral greater than left cervical segment. Given the diffuse nature of involvement, this may be consistent with the clinical diagnosis of motor neuron disease, such as amyotrophic lateral sclerosis (ALS). Given his clinical history and if only in the legs, this electrophysiologic picture could also be consistent with a polyradiculopathy with ongoing denervation, although this is less likely. There is no electrophysiologic evidence for a left ulnar mononeuropathy.  COGNITION: Overall cognitive status: Within functional limits for tasks assessed   SENSATION: WFL  COORDINATION: Heel to shin WNL  EDEMA:  None but noted L quad atrophy vs R  MUSCLE TONE: None  MUSCLE LENGTH: Did not assess  DTRs:  Did not assess  POSTURE: No Significant postural limitations  LOWER EXTREMITY ROM:      Active  Right Eval Left Eval  Hip flexion    Hip extension    Hip abduction    Hip adduction    Hip internal rotation    Hip external rotation    Knee flexion    Knee extension    Ankle dorsiflexion    Ankle plantarflexion    Ankle inversion    Ankle eversion     (Blank rows = not tested)  LOWER EXTREMITY MMT:    MMT Right Eval Left Eval  Hip flexion 5 4-  Hip extension 5 3+  Hip abduction 4+ 3  Hip adduction    Hip internal rotation    Hip external rotation    Knee flexion 5 5  Knee extension 5 3+  Ankle dorsiflexion 5 3+  Ankle plantarflexion 5 4  Ankle inversion    Ankle eversion    (Blank rows = not tested)  BED  MOBILITY:  Independent  TRANSFERS: Assistive device utilized: None  Sit to stand: Complete Independence Stand to sit: Complete Independence Chair to chair: Complete Independence Floor:  Did not assess  STAIRS: Level of Assistance: Modified independence Stair Negotiation Technique: Step to Pattern with Bilateral Rails Number of Stairs: 4  Height of Stairs: 6"  Comments: increased UE support  GAIT: Gait pattern: step through pattern, decreased stance time- Left, and decreased ankle dorsiflexion- Left Distance walked: Into clinic Assistive device utilized: None Level of assistance: Complete Independence  FUNCTIONAL TESTS:  5 times sit to stand: 16.95 sec (R weight shift) SLS: 13.28 sec on R, 1.98 sec on L Berg Balance Test: 50/56 FGA: 22/30     PATIENT SURVEYS:  Tba -- consider PSFS or LEFS  TODAY'S TREATMENT:                                                                                                                              DATE:  09/03/23 L4 x 5 min LEs only Standing heel raise 2x10 Standing gastroc stretch with slant board 2x30" Standing soleus stretch with slant board 2x30" Sitting DF + eversion 2x10 Standing fire hydrant red TB 2x10 Sitting piriformis stretch x30" Sitting SLR 2x10 Standing hip flexor stretch x  30" Standing donkey kick red TB 2x10   PATIENT EDUCATION: Education details: Exam findings, POC, initial HEP Person educated: Patient Education method: Explanation, Demonstration, and Handouts Education comprehension: verbalized understanding, returned demonstration, and needs further education  HOME EXERCISE PROGRAM: Access Code: [email or web address] URL: [email or web address] Date: 08/28/2023 Prepared by: Vernon Prey April Kirstie Peri  Exercises - Seated Ankle Dorsiflexion with Resistance  - 1 x daily - 7 x weekly - 3 sets - 10 reps - 3 sec hold - Seated Knee Extension with Resistance  - 1 x daily - 7 x weekly - 3 sets - 10 reps - 3 sec hold - Single Leg Heel Raise  - 1 x daily - 7 x weekly - 3 sets - 10 reps - Standing Hip Abduction with Bent Knee  - 1 x daily - 7 x weekly - 2 sets - 10 reps - 3 sec hold  GOALS: Goals reviewed with patient? Yes  SHORT TERM GOALS: Target date: 09/18/2023   Pt will be ind with initial HEP Baseline: Goal status: INITIAL  2.  Pt will have improved 5x STS to </=13 sec to demo increasing functional LE strength Baseline:  Goal status: INITIAL  3.  PT will administer LEFS or PSFS to obtain baseline Baseline:  Goal status: INITIAL   LONG TERM GOALS: Target date: 10/09/2023   Pt will be ind with management and progression of HEP Baseline:  Goal status: INITIAL  2.  Pt will have improved Berg Balance to >/=54/56 Baseline: 50 Goal status: INITIAL  3.  Pt will have improved FGA to >/=28/30 to demo low fall risk and MCID Baseline: 22 Goal status: INITIAL  4.  Pt will demo at least  4/5 with L LE MMT for improved stair/incline ascent/descent Baseline:  Goal status: INITIAL  5.  Pt will demo MCID on LEFS or PSFS Baseline:  Goal status: INITIAL    ASSESSMENT:  CLINICAL IMPRESSION: Reviewed HEP and modified as needed. Cues to keep from using truncal lean/lumbar lateral flexion with standing fire hydrant. Modified knee extension  exercise to SLR for more distal quad strengthening where pt has most of his atrophy -- pt found this more challenging. Added glute max strengthening with hip extension exercise.   From eval: Patient is a 67 y.o. M who was seen today for physical therapy evaluation and treatment for ongoing L LE weakness. PMH significant for PLIF T11-S1. Pt remains highly active with golf and walking; however, continues to have L LE weakness, decreased L LE endurance, with increased falls/near falls. Assessment significant for L>R LE weakness with noted atrophy leading to decreased L LE stability/balance affecting activities such as stair climbing and ambulating limiting participation in community and recreational activities. Pt remains highly functional but is wanting to perform trial of PT to see if strength can be improved in his L LE or if there remains other conditions at play that will limit progress -- pt's nerve conduction study was concerning for possible ALS which has not been completely ruled out. Pt will benefit from PT for strengthening and balance.   OBJECTIVE IMPAIRMENTS: Abnormal gait, decreased activity tolerance, decreased balance, decreased endurance, decreased mobility, difficulty walking, decreased ROM, decreased strength, increased muscle spasms, improper body mechanics, and postural dysfunction.   ACTIVITY LIMITATIONS: standing, squatting, stairs, transfers, and locomotion level  PARTICIPATION LIMITATIONS: community activity, yard work, and recreation  PERSONAL FACTORS: Age, Fitness, Past/current experiences, and Time since onset of injury/illness/exacerbation are also affecting patient's functional outcome.   REHAB POTENTIAL: Good  CLINICAL DECISION MAKING: Evolving/moderate complexity  EVALUATION COMPLEXITY: Moderate  PLAN:  PT FREQUENCY: 1-2x/week  PT DURATION: 6 weeks  PLANNED INTERVENTIONS: 97164- PT Re-evaluation, 97110-Therapeutic exercises, 97530- Therapeutic activity, [id number]  Neuromuscular re-education, 97535- Self Care, 16109- Manual therapy, [id number]- Gait training, [phone]- Orthotic Fit/training, [phone]- Aquatic Therapy, [phone]- Electrical stimulation (unattended), Patient/Family education, Balance training, Stair training, Taping, Dry Needling, Joint mobilization, Cryotherapy, and Moist heat  PLAN FOR NEXT SESSION: Assess response to HEP. Continue L LE strengthening and stability. Assess PSFS or LEFS.    Violet Cart April Ma L Shakila Mak, PT 09/03/2023, 3:33 PM

## 2023-09-10 ENCOUNTER — Encounter: Payer: Self-pay | Admitting: Physical Therapy

## 2023-09-10 ENCOUNTER — Ambulatory Visit: Payer: Medicare Other | Admitting: Physical Therapy

## 2023-09-10 DIAGNOSIS — R2689 Other abnormalities of gait and mobility: Secondary | ICD-10-CM

## 2023-09-10 DIAGNOSIS — M6281 Muscle weakness (generalized): Secondary | ICD-10-CM

## 2023-09-10 DIAGNOSIS — R2681 Unsteadiness on feet: Secondary | ICD-10-CM

## 2023-09-10 NOTE — Therapy (Signed)
OUTPATIENT PHYSICAL THERAPY NEURO TREATMENT   Patient Name: Anthony Barnett MRN: [id number] DOB:[birth year], 67 y.o., male Today's Date: 09/10/2023   PCP: Trisha Mangle, FNP REFERRING PROVIDER: Tanna Furry, MD  END OF SESSION:  PT End of Session - 09/10/23 1531     Visit Number 3    Number of Visits 6    Date for PT Re-Evaluation 10/09/23    Authorization Type BCBS    PT Start Time 1531    PT Stop Time 1615    PT Time Calculation (min) 44 min              Past Medical History:  Diagnosis Date   Allergic rhinitis    Aortic atherosclerosis (HCC) 06/2019   Noted on CT abd/pelv done for abd pain   Cervical spinal stenosis 2019   Chronic cervicalgia.  Failed conservative tx/PT-->[facility]: MRI multilevel DDD, w/multilevel foraminal stenosis, with disc extrusion at C6-7.-->referred to PT.-->got worse, so ESI done 05/2018--helped.   DDD (degenerative disc disease), lumbar    Dysthymia    Epidermoid cyst of skin    GAD (generalized anxiety disorder)    Generalized abdominal pain    recurrent 2020/2021-->GI ref 11/22/19   Hepatic steatosis 06/2019   Noted on CT abd/pelv   Hx of adenomatous polyp of colon 2008; 2013;09/2019   2008 and 2013 Dr. Perfecto Kingdom, [facility] [place], [place]. 2020 Dr. Sabino Donovan 09/2022.   Inguinal hernia recurrent bilateral 10/2017   CT abd/pelv.  Fat-containing.  +Symptomatic.  Referred to gen surg 11/17/17.   Insomnia secondary to anxiety    stress mainly related to work   Left hip pain    ortho 05/2018    Obesity, Class I, BMI 30-34.9    Osteoarthritis of both knees    TKA on left; 10/2017 ([facility]).     Past Surgical History:  Procedure Laterality Date   APPENDECTOMY  1968   BACK SURGERY  2012   l4-5 l5 S1   COLONOSCOPY  2008'; 05/20/2012; 09/2019   2008; adenomatous polyps.  2013 Tubular adenoma:--recall 5 yrs.    Diverticulosis - Dr. Norval Gable. 09/2019 adenomatous polyps->recall 3 yrs.    HERNIA REPAIR  1977   L inguinal   KNEE ARTHROPLASTY Left 09/15/2017   Procedure: LEFT TOTAL KNEE ARTHROPLASTY WITH COMPUTER NAVIGATION;  Surgeon: Samson Frederic, MD;  Location: [facility] OR;  Service: Orthopedics;  Laterality: Left;  Needs RNFA   KNEE ARTHROSCOPY     Bilateral (L x 1, R x 1)   TONSILLECTOMY AND ADENOIDECTOMY  1985   TOTAL SHOULDER REPLACEMENT     Left    TOTAL SHOULDER REPLACEMENT Right 12/10/2017   Done at [facility] in [place], [place].   Patient Active Problem List   Diagnosis Date Noted   Hx of adenomatous polyp of colon    Hepatic steatosis 06/2019   Aortic atherosclerosis (HCC) 06/2019   Inguinal hernia recurrent bilateral 10/2017   Osteoarthritis of left knee 09/15/2017   Lumbar radiculopathy 05/02/2011    ONSET DATE: 09/20/21  REFERRING DIAG: O17.510 (ICD-10-CM) - Left leg weakness  THERAPY DIAG:  Muscle weakness (generalized)  Other abnormalities of gait and mobility  Unsteadiness on feet  Rationale for Evaluation and Treatment: Rehabilitation  SUBJECTIVE:  SUBJECTIVE STATEMENT: Pt states he's been working on his exercises. Did well in golf on Monday.    From eval: Pt states he had an 8 level spinal fusion sugery T11-S2 on 09/20/21. Pt states pain was improved but had weakness. Went through PT and able to return to golfing; however, had continued weakness. Had nerve conduction test that demonstrated concern for ALS. Saw ALS specialist in April 2024 but did not think it was definitive. Pt went back in 6 months but nothing significant had occurred except 1 fall. Pt reports he is a little weaker but not enough to do another nerve conduction test. Has not ruled out ALS. Pt states he gets drop foot on L resulting in catching of his toe with walking and difficulty with  stairs. Plays golf (Mondays and Thursdays) and walks regularly. Does get spasms at times. Will stub his toe or his knee can buckle.  Pt accompanied by: self  PERTINENT HISTORY: L TKA, spinal fusion T11-S2, history of prior discectomy L1-2  PAIN:  Are you having pain? No  PRECAUTIONS: None  RED FLAGS: None   WEIGHT BEARING RESTRICTIONS: No  FALLS: Has patient fallen in last 6 months? Yes. Number of falls 1 - pt states his foot caught and tripped over his own foot  LIVING ENVIRONMENT: Lives with: lives with their spouse Lives in: House/apartment Stairs: Yes: Internal: 14 steps; on right going up and External: 6 steps; on right going up Has following equipment at home: None  PLOF: Independent  PATIENT GOALS: Improve strength if able  OBJECTIVE:  Note: Objective measures were completed at Evaluation unless otherwise noted.  DIAGNOSTIC FINDINGS: EMG/NCS/NMUS (01/07/2023): This is a complex, abnormal study. There is electrophysiologic evidence of a diffuse, chronic neurogenic process with active denervation and fasciculations affecting the lower motor neurons and/or their axons within the bilateral lumbosacral greater than left cervical segment. Given the diffuse nature of involvement, this may be consistent with the clinical diagnosis of motor neuron disease, such as amyotrophic lateral sclerosis (ALS). Given his clinical history and if only in the legs, this electrophysiologic picture could also be consistent with a polyradiculopathy with ongoing denervation, although this is less likely. There is no electrophysiologic evidence for a left ulnar mononeuropathy.  COGNITION: Overall cognitive status: Within functional limits for tasks assessed   SENSATION: WFL  COORDINATION: Heel to shin WNL  EDEMA:  None but noted L quad atrophy vs R  MUSCLE TONE: None  MUSCLE LENGTH: Did not assess  DTRs:  Did not assess  POSTURE: No Significant postural limitations  LOWER EXTREMITY  ROM:     Active  Right Eval Left Eval  Hip flexion    Hip extension    Hip abduction    Hip adduction    Hip internal rotation    Hip external rotation    Knee flexion    Knee extension    Ankle dorsiflexion    Ankle plantarflexion    Ankle inversion    Ankle eversion     (Blank rows = not tested)  LOWER EXTREMITY MMT:    MMT Right Eval Left Eval  Hip flexion 5 4-  Hip extension 5 3+  Hip abduction 4+ 3  Hip adduction    Hip internal rotation    Hip external rotation    Knee flexion 5 5  Knee extension 5 3+  Ankle dorsiflexion 5 3+  Ankle plantarflexion 5 4  Ankle inversion    Ankle eversion    (Blank rows = not  tested)  BED MOBILITY:  Independent  TRANSFERS: Assistive device utilized: None  Sit to stand: Complete Independence Stand to sit: Complete Independence Chair to chair: Complete Independence Floor:  Did not assess  STAIRS: Level of Assistance: Modified independence Stair Negotiation Technique: Step to Pattern with Bilateral Rails Number of Stairs: 4  Height of Stairs: 6"  Comments: increased UE support  GAIT: Gait pattern: step through pattern, decreased stance time- Left, and decreased ankle dorsiflexion- Left Distance walked: Into clinic Assistive device utilized: None Level of assistance: Complete Independence  FUNCTIONAL TESTS:  5 times sit to stand: 16.95 sec (R weight shift) SLS: 13.28 sec on R, 1.98 sec on L Berg Balance Test: 50/56 FGA: 22/30     PATIENT SURVEYS:  Tba -- consider PSFS or LEFS  TODAY'S TREATMENT:                                                                                                                              DATE:  09/10/23 Sci Fit L4 x 5 min LEs only Standing gastroc stretch with slant board x30" Standing soleus stretch with slant board x30" Staggered stance DF + eversion green TB 2x10 Side step green TB 2x10 Backwards and forwards monster walk green TB 2x10 Forward tap on cone  2x10   09/03/23 L4 x 5 min LEs only Standing heel raise 2x10 Standing gastroc stretch with slant board 2x30" Standing soleus stretch with slant board 2x30" Sitting DF + eversion 2x10 Standing fire hydrant red TB 2x10 Sitting piriformis stretch x30" Sitting SLR 2x10 Standing hip flexor stretch x 30" Standing donkey kick red TB 2x10   PATIENT EDUCATION: Education details: Exam findings, POC, initial HEP Person educated: Patient Education method: Explanation, Demonstration, and Handouts Education comprehension: verbalized understanding, returned demonstration, and needs further education  HOME EXERCISE PROGRAM: Access Code: [email or web address] URL: [email or web address] Date: 08/28/2023 Prepared by: Vernon Prey April Kirstie Peri  Exercises - Seated Ankle Dorsiflexion with Resistance  - 1 x daily - 7 x weekly - 3 sets - 10 reps - 3 sec hold - Seated Knee Extension with Resistance  - 1 x daily - 7 x weekly - 3 sets - 10 reps - 3 sec hold - Single Leg Heel Raise  - 1 x daily - 7 x weekly - 3 sets - 10 reps - Standing Hip Abduction with Bent Knee  - 1 x daily - 7 x weekly - 2 sets - 10 reps - 3 sec hold  GOALS: Goals reviewed with patient? Yes  SHORT TERM GOALS: Target date: 09/18/2023   Pt will be ind with initial HEP Baseline: Goal status: INITIAL  2.  Pt will have improved 5x STS to </=13 sec to demo increasing functional LE strength Baseline:  Goal status: INITIAL  3.  PT will administer LEFS or PSFS to obtain baseline Baseline:  Goal status: INITIAL   LONG TERM GOALS: Target date: 10/09/2023   Pt will be ind with management and  progression of HEP Baseline:  Goal status: INITIAL  2.  Pt will have improved Berg Balance to >/=54/56 Baseline: 50 Goal status: INITIAL  3.  Pt will have improved FGA to >/=28/30 to demo low fall risk and MCID Baseline: 22 Goal status: INITIAL  4.  Pt will demo at least 4/5 with L LE MMT for improved stair/incline  ascent/descent Baseline:  Goal status: INITIAL  5.  Pt will demo MCID on LEFS or PSFS Baseline:  Goal status: INITIAL    ASSESSMENT:  CLINICAL IMPRESSION: Added more dynamic strengthening/balance to HEP. Able to tolerate progression to green TB. Challenged with L LE stabilization.   From eval: Patient is a 67 y.o. M who was seen today for physical therapy evaluation and treatment for ongoing L LE weakness. PMH significant for PLIF T11-S1. Pt remains highly active with golf and walking; however, continues to have L LE weakness, decreased L LE endurance, with increased falls/near falls. Assessment significant for L>R LE weakness with noted atrophy leading to decreased L LE stability/balance affecting activities such as stair climbing and ambulating limiting participation in community and recreational activities. Pt remains highly functional but is wanting to perform trial of PT to see if strength can be improved in his L LE or if there remains other conditions at play that will limit progress -- pt's nerve conduction study was concerning for possible ALS which has not been completely ruled out. Pt will benefit from PT for strengthening and balance.   OBJECTIVE IMPAIRMENTS: Abnormal gait, decreased activity tolerance, decreased balance, decreased endurance, decreased mobility, difficulty walking, decreased ROM, decreased strength, increased muscle spasms, improper body mechanics, and postural dysfunction.   ACTIVITY LIMITATIONS: standing, squatting, stairs, transfers, and locomotion level  PARTICIPATION LIMITATIONS: community activity, yard work, and recreation  PERSONAL FACTORS: Age, Fitness, Past/current experiences, and Time since onset of injury/illness/exacerbation are also affecting patient's functional outcome.   REHAB POTENTIAL: Good  CLINICAL DECISION MAKING: Evolving/moderate complexity  EVALUATION COMPLEXITY: Moderate  PLAN:  PT FREQUENCY: 1-2x/week  PT DURATION: 6  weeks  PLANNED INTERVENTIONS: 97164- PT Re-evaluation, 97110-Therapeutic exercises, 97530- Therapeutic activity, [id number]- Neuromuscular re-education, 97535- Self Care, 16109- Manual therapy, [id number]- Gait training, [phone]- Orthotic Fit/training, [phone]- Aquatic Therapy, [phone]- Electrical stimulation (unattended), Patient/Family education, Balance training, Stair training, Taping, Dry Needling, Joint mobilization, Cryotherapy, and Moist heat  PLAN FOR NEXT SESSION: Assess response to HEP. Continue L LE strengthening and stability. Assess PSFS or LEFS.    Hazaiah [place] April Ma L Maanya Hippert, PT 09/10/2023, 3:31 PM

## 2023-09-15 ENCOUNTER — Ambulatory Visit: Payer: Medicare Other | Admitting: Physical Therapy

## 2023-09-15 DIAGNOSIS — M6281 Muscle weakness (generalized): Secondary | ICD-10-CM | POA: Diagnosis not present

## 2023-09-15 DIAGNOSIS — R2689 Other abnormalities of gait and mobility: Secondary | ICD-10-CM

## 2023-09-15 DIAGNOSIS — R2681 Unsteadiness on feet: Secondary | ICD-10-CM

## 2023-09-15 NOTE — Therapy (Signed)
OUTPATIENT PHYSICAL THERAPY NEURO TREATMENT   Patient Name: Anthony Barnett MRN: [id number] DOB:08/10/1956, 67 y.o., male Today's Date: 09/15/2023   PCP: Trisha Mangle, FNP REFERRING PROVIDER: Tanna Furry, MD  END OF SESSION:  PT End of Session - 09/15/23 1448     Visit Number 4    Number of Visits 6    Date for PT Re-Evaluation 10/09/23    Authorization Type BCBS    PT Start Time 1448    PT Stop Time 1530    PT Time Calculation (min) 42 min    Activity Tolerance Patient tolerated treatment well               Past Medical History:  Diagnosis Date   Allergic rhinitis    Aortic atherosclerosis (HCC) 06/2019   Noted on CT abd/pelv done for abd pain   Cervical spinal stenosis 2019   Chronic cervicalgia.  Failed conservative tx/PT-->[facility]: MRI multilevel DDD, w/multilevel foraminal stenosis, with disc extrusion at C6-7.-->referred to PT.-->got worse, so ESI done 05/2018--helped.   DDD (degenerative disc disease), lumbar    Dysthymia    Epidermoid cyst of skin    GAD (generalized anxiety disorder)    Generalized abdominal pain    recurrent 2020/2021-->GI ref 11/22/19   Hepatic steatosis 06/2019   Noted on CT abd/pelv   Hx of adenomatous polyp of colon 2008; 2013;09/2019   2008 and 2013 Dr. Perfecto Kingdom, [facility] [place], [place]. 2020 Dr. Sabino Donovan 09/2022.   Inguinal hernia recurrent bilateral 10/2017   CT abd/pelv.  Fat-containing.  +Symptomatic.  Referred to gen surg 11/17/17.   Insomnia secondary to anxiety    stress mainly related to work   Left hip pain    ortho 05/2018    Obesity, Class I, BMI 30-34.9    Osteoarthritis of both knees    TKA on left; 10/2017 ([facility]).     Past Surgical History:  Procedure Laterality Date   APPENDECTOMY  1968   BACK SURGERY  2012   l4-5 l5 S1   COLONOSCOPY  2008'; 05/20/2012; 09/2019   2008; adenomatous polyps.  2013 Tubular adenoma:--recall 5 yrs.    Diverticulosis - Dr.  Norval Gable. 09/2019 adenomatous polyps->recall 3 yrs.   HERNIA REPAIR  1977   L inguinal   KNEE ARTHROPLASTY Left 09/15/2017   Procedure: LEFT TOTAL KNEE ARTHROPLASTY WITH COMPUTER NAVIGATION;  Surgeon: Samson Frederic, MD;  Location: [facility] OR;  Service: Orthopedics;  Laterality: Left;  Needs RNFA   KNEE ARTHROSCOPY     Bilateral (L x 1, R x 1)   TONSILLECTOMY AND ADENOIDECTOMY  1985   TOTAL SHOULDER REPLACEMENT     Left    TOTAL SHOULDER REPLACEMENT Right 12/10/2017   Done at [facility] in [place], [place].   Patient Active Problem List   Diagnosis Date Noted   Hx of adenomatous polyp of colon    Hepatic steatosis 06/2019   Aortic atherosclerosis (HCC) 06/2019   Inguinal hernia recurrent bilateral 10/2017   Osteoarthritis of left knee 09/15/2017   Lumbar radiculopathy 05/02/2011    ONSET DATE: 09/20/21  REFERRING DIAG: L24.401 (ICD-10-CM) - Left leg weakness  THERAPY DIAG:  No diagnosis found.  Rationale for Evaluation and Treatment: Rehabilitation  SUBJECTIVE:  SUBJECTIVE STATEMENT: Pt states the monster walks and side stepping have been challenging. Has continued to walk pretty much daily. Just got off golf course. Pt has noted he seems to fatigue during the last 4 holes of golf. Sore in glutes and calves.   From eval: Pt states he had an 8 level spinal fusion sugery T11-S2 on 09/20/21. Pt states pain was improved but had weakness. Went through PT and able to return to golfing; however, had continued weakness. Had nerve conduction test that demonstrated concern for ALS. Saw ALS specialist in April 2024 but did not think it was definitive. Pt went back in 6 months but nothing significant had occurred except 1 fall. Pt reports he is a little weaker but not enough to do another nerve conduction  test. Has not ruled out ALS. Pt states he gets drop foot on L resulting in catching of his toe with walking and difficulty with stairs. Plays golf (Mondays and Thursdays) and walks regularly. Does get spasms at times. Will stub his toe or his knee can buckle.  Pt accompanied by: self  PERTINENT HISTORY: L TKA, spinal fusion T11-S2, history of prior discectomy L1-2  PAIN:  Are you having pain? No  PRECAUTIONS: None  RED FLAGS: None   WEIGHT BEARING RESTRICTIONS: No  FALLS: Has patient fallen in last 6 months? Yes. Number of falls 1 - pt states his foot caught and tripped over his own foot  LIVING ENVIRONMENT: Lives with: lives with their spouse Lives in: House/apartment Stairs: Yes: Internal: 14 steps; on right going up and External: 6 steps; on right going up Has following equipment at home: None  PLOF: Independent  PATIENT GOALS: Improve strength if able  OBJECTIVE:  Note: Objective measures were completed at Evaluation unless otherwise noted.  DIAGNOSTIC FINDINGS: EMG/NCS/NMUS (01/07/2023): This is a complex, abnormal study. There is electrophysiologic evidence of a diffuse, chronic neurogenic process with active denervation and fasciculations affecting the lower motor neurons and/or their axons within the bilateral lumbosacral greater than left cervical segment. Given the diffuse nature of involvement, this may be consistent with the clinical diagnosis of motor neuron disease, such as amyotrophic lateral sclerosis (ALS). Given his clinical history and if only in the legs, this electrophysiologic picture could also be consistent with a polyradiculopathy with ongoing denervation, although this is less likely. There is no electrophysiologic evidence for a left ulnar mononeuropathy.  COORDINATION: Heel to shin WNL  EDEMA:  None but noted L quad atrophy vs R  LOWER EXTREMITY MMT:    MMT Right Eval Left Eval  Hip flexion 5 4-  Hip extension 5 3+  Hip abduction 4+ 3  Hip  adduction    Hip internal rotation    Hip external rotation    Knee flexion 5 5  Knee extension 5 3+  Ankle dorsiflexion 5 3+  Ankle plantarflexion 5 4  Ankle inversion    Ankle eversion    (Blank rows = not tested)   STAIRS: Level of Assistance: Modified independence Stair Negotiation Technique: Step to Pattern with Bilateral Rails Number of Stairs: 4  Height of Stairs: 6"  Comments: increased UE support  GAIT: Gait pattern: step through pattern, decreased stance time- Left, and decreased ankle dorsiflexion- Left Distance walked: Into clinic Assistive device utilized: None Level of assistance: Complete Independence  FUNCTIONAL TESTS:  5 times sit to stand: 16.95 sec (R weight shift)  (eval)  12.03 sec (09/15/23)   SLS: 13.28 sec on R, 1.98 sec on L Berg  Balance Test: 50/56 FGA: 22/30     PATIENT SURVEYS:  Tba -- consider PSFS or LEFS  TODAY'S TREATMENT:                                                                                                                              DATE:  09/15/23 Sci Fit L4 x 5 min LEs only Figure 4 stretch x30" Piriformis stretch 2x30" Checking STGs Tandem stance x30" SLS 3x10" Eccentric forward step downs on aerobic stepper 3x10 with UE support Eccentric side step downs on aerobic stepper 3x10 with UE support Eccentric L single leg press 80# 2x10  09/10/23 Sci Fit L4 x 5 min LEs only Standing gastroc stretch with slant board x30" Standing soleus stretch with slant board x30" Staggered stance DF + eversion green TB 2x10 Side step green TB 2x10 Backwards and forwards monster walk green TB 2x10 Forward tap on cone 2x10   09/03/23 L4 x 5 min LEs only Standing heel raise 2x10 Standing gastroc stretch with slant board 2x30" Standing soleus stretch with slant board 2x30" Sitting DF + eversion 2x10 Standing fire hydrant red TB 2x10 Sitting piriformis stretch x30" Sitting SLR 2x10 Standing hip flexor stretch x 30" Standing  donkey kick red TB 2x10   PATIENT EDUCATION: Education details: Exam findings, POC, initial HEP Person educated: Patient Education method: Explanation, Demonstration, and Handouts Education comprehension: verbalized understanding, returned demonstration, and needs further education  HOME EXERCISE PROGRAM: Access Code: [email or web address] URL: [email or web address] Date: 08/28/2023 Prepared by: Vernon Prey April Kirstie Peri  Exercises - Seated Ankle Dorsiflexion with Resistance  - 1 x daily - 7 x weekly - 3 sets - 10 reps - 3 sec hold - Seated Knee Extension with Resistance  - 1 x daily - 7 x weekly - 3 sets - 10 reps - 3 sec hold - Single Leg Heel Raise  - 1 x daily - 7 x weekly - 3 sets - 10 reps - Standing Hip Abduction with Bent Knee  - 1 x daily - 7 x weekly - 2 sets - 10 reps - 3 sec hold  GOALS: Goals reviewed with patient? Yes  SHORT TERM GOALS: Target date: 09/18/2023   Pt will be ind with initial HEP Baseline: Goal status: MET  2.  Pt will have improved 5x STS to </=13 sec to demo increasing functional LE strength Baseline:  Goal status: MET (see above)  3.  PT will administer LEFS or PSFS to obtain baseline Baseline:  Goal status: DEFERRED   LONG TERM GOALS: Target date: 10/09/2023   Pt will be ind with management and progression of HEP Baseline:  Goal status: INITIAL  2.  Pt will have improved Berg Balance to >/=54/56 Baseline: 50 Goal status: INITIAL  3.  Pt will have improved FGA to >/=28/30 to demo low fall risk and MCID Baseline: 22 Goal status: INITIAL  4.  Pt will demo at least 4/5 with L LE MMT for improved  stair/incline ascent/descent Baseline:  Goal status: INITIAL  5.  Pt will demo MCID on LEFS or PSFS Baseline:  Goal status: INITIAL    ASSESSMENT:  CLINICAL IMPRESSION: Increased L glute soreness noted. Provided hip stretches to address today. Focused more on quad strengthening -- particularly doing eccentrics. Discussed with pt  increasing sets and reps but dropping to lower resistance when he does his HEP since he is noting difficulties with maintaining strength throughout the day. Pt has been able to demonstrate increase in L LE stability. He is able to perform L SLS for >10 sec today. He has met his STGs for his 5x STS.   From eval: Patient is a 67 y.o. M who was seen today for physical therapy evaluation and treatment for ongoing L LE weakness. PMH significant for PLIF T11-S1. Pt remains highly active with golf and walking; however, continues to have L LE weakness, decreased L LE endurance, with increased falls/near falls. Assessment significant for L>R LE weakness with noted atrophy leading to decreased L LE stability/balance affecting activities such as stair climbing and ambulating limiting participation in community and recreational activities. Pt remains highly functional but is wanting to perform trial of PT to see if strength can be improved in his L LE or if there remains other conditions at play that will limit progress -- pt's nerve conduction study was concerning for possible ALS which has not been completely ruled out. Pt will benefit from PT for strengthening and balance.   OBJECTIVE IMPAIRMENTS: Abnormal gait, decreased activity tolerance, decreased balance, decreased endurance, decreased mobility, difficulty walking, decreased ROM, decreased strength, increased muscle spasms, improper body mechanics, and postural dysfunction.    PLAN:  PT FREQUENCY: 1-2x/week  PT DURATION: 6 weeks  PLANNED INTERVENTIONS: 97164- PT Re-evaluation, 97110-Therapeutic exercises, 97530- Therapeutic activity, [id number]- Neuromuscular re-education, 97535- Self Care, 16109- Manual therapy, [phone]- Gait training, [phone]- Orthotic Fit/training, [phone]- Aquatic Therapy, [phone]- Electrical stimulation (unattended), Patient/Family education, Balance training, Stair training, Taping, Dry Needling, Joint mobilization, Cryotherapy, and Moist  heat  PLAN FOR NEXT SESSION: Assess response to HEP. Continue L LE strengthening and stability. Assess PSFS or LEFS.    Jahnessa Vanduyn April Ma L Sanii Kukla, PT 09/15/2023, 2:49 PM

## 2023-09-24 ENCOUNTER — Ambulatory Visit: Payer: Medicare Other | Attending: Family Medicine | Admitting: Physical Therapy

## 2023-09-24 DIAGNOSIS — R2681 Unsteadiness on feet: Secondary | ICD-10-CM | POA: Diagnosis present

## 2023-09-24 DIAGNOSIS — R2689 Other abnormalities of gait and mobility: Secondary | ICD-10-CM | POA: Diagnosis present

## 2023-09-24 DIAGNOSIS — M6281 Muscle weakness (generalized): Secondary | ICD-10-CM | POA: Diagnosis present

## 2023-09-24 NOTE — Therapy (Addendum)
OUTPATIENT PHYSICAL THERAPY NEURO TREATMENT   Patient Name: Anthony Barnett MRN: [id number] DOB:19-Feb-1956, 67 y.o., male Today's Date: 09/24/2023   PCP: Trisha Mangle, FNP REFERRING PROVIDER: Tanna Furry, MD  END OF SESSION:  PT End of Session - 09/24/23 1533     Visit Number 5    Number of Visits 6    Date for PT Re-Evaluation 10/09/23    Authorization Type BCBS    PT Start Time 1533    PT Stop Time 1615    PT Time Calculation (min) 42 min    Activity Tolerance Patient tolerated treatment well                Past Medical History:  Diagnosis Date   Allergic rhinitis    Aortic atherosclerosis (HCC) 06/2019   Noted on CT abd/pelv done for abd pain   Cervical spinal stenosis 2019   Chronic cervicalgia.  Failed conservative tx/PT-->[facility]: MRI multilevel DDD, w/multilevel foraminal stenosis, with disc extrusion at C6-7.-->referred to PT.-->got worse, so ESI done 05/2018--helped.   DDD (degenerative disc disease), lumbar    Dysthymia    Epidermoid cyst of skin    GAD (generalized anxiety disorder)    Generalized abdominal pain    recurrent 2020/2021-->GI ref 11/22/19   Hepatic steatosis 06/2019   Noted on CT abd/pelv   Hx of adenomatous polyp of colon 2008; 2013;09/2019   2008 and 2013 Dr. Perfecto Kingdom, [facility] [place], [place]. 2020 Dr. Sabino Donovan 09/2022.   Inguinal hernia recurrent bilateral 10/2017   CT abd/pelv.  Fat-containing.  +Symptomatic.  Referred to gen surg 11/17/17.   Insomnia secondary to anxiety    stress mainly related to work   Left hip pain    ortho 05/2018    Obesity, Class I, BMI 30-34.9    Osteoarthritis of both knees    TKA on left; 10/2017 ([facility]).     Past Surgical History:  Procedure Laterality Date   APPENDECTOMY  1968   BACK SURGERY  2012   l4-5 l5 S1   COLONOSCOPY  2008'; 05/20/2012; 09/2019   2008; adenomatous polyps.  2013 Tubular adenoma:--recall 5 yrs.    Diverticulosis - Dr.  Norval Gable. 09/2019 adenomatous polyps->recall 3 yrs.   HERNIA REPAIR  1977   L inguinal   KNEE ARTHROPLASTY Left 09/15/2017   Procedure: LEFT TOTAL KNEE ARTHROPLASTY WITH COMPUTER NAVIGATION;  Surgeon: Samson Frederic, MD;  Location: [facility] OR;  Service: Orthopedics;  Laterality: Left;  Needs RNFA   KNEE ARTHROSCOPY     Bilateral (L x 1, R x 1)   TONSILLECTOMY AND ADENOIDECTOMY  1985   TOTAL SHOULDER REPLACEMENT     Left    TOTAL SHOULDER REPLACEMENT Right 12/10/2017   Done at [facility] in [place], [place].   Patient Active Problem List   Diagnosis Date Noted   Hx of adenomatous polyp of colon    Hepatic steatosis 06/2019   Aortic atherosclerosis (HCC) 06/2019   Inguinal hernia recurrent bilateral 10/2017   Osteoarthritis of left knee 09/15/2017   Lumbar radiculopathy 05/02/2011    ONSET DATE: 09/20/21  REFERRING DIAG: S50.539 (ICD-10-CM) - Left leg weakness  THERAPY DIAG:  No diagnosis found.  Rationale for Evaluation and Treatment: Rehabilitation  SUBJECTIVE:  SUBJECTIVE STATEMENT: Pt states he has been walking and doing his exercises 3 sets of 10. Reports a lot of glute soreness.   From eval: Pt states he had an 8 level spinal fusion sugery T11-S2 on 09/20/21. Pt states pain was improved but had weakness. Went through PT and able to return to golfing; however, had continued weakness. Had nerve conduction test that demonstrated concern for ALS. Saw ALS specialist in April 2024 but did not think it was definitive. Pt went back in 6 months but nothing significant had occurred except 1 fall. Pt reports he is a little weaker but not enough to do another nerve conduction test. Has not ruled out ALS. Pt states he gets drop foot on L resulting in catching of his toe with walking and difficulty with  stairs. Plays golf (Mondays and Thursdays) and walks regularly. Does get spasms at times. Will stub his toe or his knee can buckle.  Pt accompanied by: self  PERTINENT HISTORY: L TKA, spinal fusion T11-S2, history of prior discectomy L1-2  PAIN:  Are you having pain? No  PRECAUTIONS: None  RED FLAGS: None   WEIGHT BEARING RESTRICTIONS: No  FALLS: Has patient fallen in last 6 months? Yes. Number of falls 1 - pt states his foot caught and tripped over his own foot  LIVING ENVIRONMENT: Lives with: lives with their spouse Lives in: House/apartment Stairs: Yes: Internal: 14 steps; on right going up and External: 6 steps; on right going up Has following equipment at home: None  PLOF: Independent  PATIENT GOALS: Improve strength if able  OBJECTIVE:  Note: Objective measures were completed at Evaluation unless otherwise noted.  DIAGNOSTIC FINDINGS: EMG/NCS/NMUS (01/07/2023): This is a complex, abnormal study. There is electrophysiologic evidence of a diffuse, chronic neurogenic process with active denervation and fasciculations affecting the lower motor neurons and/or their axons within the bilateral lumbosacral greater than left cervical segment. Given the diffuse nature of involvement, this may be consistent with the clinical diagnosis of motor neuron disease, such as amyotrophic lateral sclerosis (ALS). Given his clinical history and if only in the legs, this electrophysiologic picture could also be consistent with a polyradiculopathy with ongoing denervation, although this is less likely. There is no electrophysiologic evidence for a left ulnar mononeuropathy.  COORDINATION: Heel to shin WNL  EDEMA:  None but noted L quad atrophy vs R  LOWER EXTREMITY MMT:    MMT Right Eval Left Eval  Hip flexion 5 4-  Hip extension 5 3+  Hip abduction 4+ 3  Hip adduction    Hip internal rotation    Hip external rotation    Knee flexion 5 5  Knee extension 5 3+  Ankle dorsiflexion 5 3+   Ankle plantarflexion 5 4  Ankle inversion    Ankle eversion    (Blank rows = not tested)   STAIRS: Level of Assistance: Modified independence Stair Negotiation Technique: Step to Pattern with Bilateral Rails Number of Stairs: 4  Height of Stairs: 6"  Comments: increased UE support  GAIT: Gait pattern: step through pattern, decreased stance time- Left, and decreased ankle dorsiflexion- Left Distance walked: Into clinic Assistive device utilized: None Level of assistance: Complete Independence  FUNCTIONAL TESTS:  5 times sit to stand: 16.95 sec (R weight shift)  (eval)  12.03 sec (09/15/23)   SLS: 13.28 sec on R, 1.98 sec on L Berg Balance Test: 50/56 FGA: 22/30     PATIENT SURVEYS:  Tba -- consider PSFS or LEFS  TODAY'S TREATMENT:  DATE:  09/24/23 Sci Fit L5 x 5 min UEs/LEs only Piriformis stretch 2x 30" Primal push up 3x20" Squat with ball squeeze, with heels on slant board 3x10 Terminal knee ext blue TB 10x5" Standing SLR blue TB 3x10x3" Standing hip adduction blue TB 3x10x3"   09/15/23 Sci Fit L4 x 5 min LEs only Figure 4 stretch x30" Piriformis stretch 2x30" Checking STGs Tandem stance x30" SLS 3x10" Eccentric forward step downs on aerobic stepper 3x10 with UE support Eccentric side step downs on aerobic stepper 3x10 with UE support Eccentric L single leg press 80# 2x10  09/10/23 Sci Fit L4 x 5 min LEs only Standing gastroc stretch with slant board x30" Standing soleus stretch with slant board x30" Staggered stance DF + eversion green TB 2x10 Side step green TB 2x10 Backwards and forwards monster walk green TB 2x10 Forward tap on cone 2x10   09/03/23 L4 x 5 min LEs only Standing heel raise 2x10 Standing gastroc stretch with slant board 2x30" Standing soleus stretch with slant board 2x30" Sitting DF + eversion  2x10 Standing fire hydrant red TB 2x10 Sitting piriformis stretch x30" Sitting SLR 2x10 Standing hip flexor stretch x 30" Standing donkey kick red TB 2x10   PATIENT EDUCATION: Education details: Exam findings, POC, initial HEP Person educated: Patient Education method: Explanation, Demonstration, and Handouts Education comprehension: verbalized understanding, returned demonstration, and needs further education  HOME EXERCISE PROGRAM: Access Code: [email or web address] URL: [email or web address] Date: 08/28/2023 Prepared by: Vernon Prey April Kirstie Peri  Exercises - Seated Ankle Dorsiflexion with Resistance  - 1 x daily - 7 x weekly - 3 sets - 10 reps - 3 sec hold - Seated Knee Extension with Resistance  - 1 x daily - 7 x weekly - 3 sets - 10 reps - 3 sec hold - Single Leg Heel Raise  - 1 x daily - 7 x weekly - 3 sets - 10 reps - Standing Hip Abduction with Bent Knee  - 1 x daily - 7 x weekly - 2 sets - 10 reps - 3 sec hold  GOALS: Goals reviewed with patient? Yes  SHORT TERM GOALS: Target date: 09/18/2023   Pt will be ind with initial HEP Baseline: Goal status: MET  2.  Pt will have improved 5x STS to </=13 sec to demo increasing functional LE strength Baseline:  Goal status: MET (see above)  3.  PT will administer LEFS or PSFS to obtain baseline Baseline:  Goal status: DEFERRED   LONG TERM GOALS: Target date: 10/09/2023   Pt will be ind with management and progression of HEP Baseline:  Goal status: INITIAL  2.  Pt will have improved Berg Balance to >/=54/56 Baseline: 50 Goal status: INITIAL  3.  Pt will have improved FGA to >/=28/30 to demo low fall risk and MCID Baseline: 22 Goal status: INITIAL  4.  Pt will demo at least 4/5 with L LE MMT for improved stair/incline ascent/descent Baseline:  Goal status: INITIAL  5.  Pt will demo MCID on LEFS or PSFS Baseline:  Goal status: INITIAL    ASSESSMENT:  CLINICAL IMPRESSION: Focused primarily on quad  strengthening this session. Able to tolerate well without any adverse effects.   From eval: Patient is a 67 y.o. M who was seen today for physical therapy evaluation and treatment for ongoing L LE weakness. PMH significant for PLIF T11-S1. Pt remains highly active with golf and walking; however, continues to have L LE weakness, decreased L LE endurance, with increased falls/near falls. Assessment  significant for L>R LE weakness with noted atrophy leading to decreased L LE stability/balance affecting activities such as stair climbing and ambulating limiting participation in community and recreational activities. Pt remains highly functional but is wanting to perform trial of PT to see if strength can be improved in his L LE or if there remains other conditions at play that will limit progress -- pt's nerve conduction study was concerning for possible ALS which has not been completely ruled out. Pt will benefit from PT for strengthening and balance.   OBJECTIVE IMPAIRMENTS: Abnormal gait, decreased activity tolerance, decreased balance, decreased endurance, decreased mobility, difficulty walking, decreased ROM, decreased strength, increased muscle spasms, improper body mechanics, and postural dysfunction.    PLAN:  PT FREQUENCY: 1-2x/week  PT DURATION: 6 weeks  PLANNED INTERVENTIONS: 97164- PT Re-evaluation, 97110-Therapeutic exercises, 97530- Therapeutic activity, [id number]- Neuromuscular re-education, 97535- Self Care, 60454- Manual therapy, [phone]- Gait training, [phone]- Orthotic Fit/training, [phone]- Aquatic Therapy, [phone]- Electrical stimulation (unattended), Patient/Family education, Balance training, Stair training, Taping, Dry Needling, Joint mobilization, Cryotherapy, and Moist heat  PLAN FOR NEXT SESSION: Assess response to HEP. Continue L LE strengthening and stability. Assess PSFS or LEFS.    Yeraldy Spike April Ma L Quran Vasco, PT 09/24/2023, 3:33 PM

## 2023-10-08 ENCOUNTER — Ambulatory Visit: Payer: Medicare Other | Admitting: Physical Therapy

## 2023-10-08 ENCOUNTER — Encounter: Payer: Self-pay | Admitting: Physical Therapy

## 2023-10-08 DIAGNOSIS — M6281 Muscle weakness (generalized): Secondary | ICD-10-CM

## 2023-10-08 DIAGNOSIS — R2681 Unsteadiness on feet: Secondary | ICD-10-CM

## 2023-10-08 DIAGNOSIS — R2689 Other abnormalities of gait and mobility: Secondary | ICD-10-CM

## 2023-10-08 NOTE — Therapy (Signed)
OUTPATIENT PHYSICAL THERAPY NEURO TREATMENT   Patient Name: Anthony Barnett MRN: [id number] DOB:01/16/1956, 67 y.o., male Today's Date: 10/08/2023   PCP: Trisha Mangle, FNP REFERRING PROVIDER: Tanna Furry, MD  END OF SESSION:  PT End of Session - 10/08/23 1521     Visit Number 6    Number of Visits 6    Date for PT Re-Evaluation 10/09/23    Authorization Type BCBS    PT Start Time 1525    PT Stop Time 1605    PT Time Calculation (min) 40 min    Activity Tolerance Patient tolerated treatment well                 Past Medical History:  Diagnosis Date   Allergic rhinitis    Aortic atherosclerosis (HCC) 06/2019   Noted on CT abd/pelv done for abd pain   Cervical spinal stenosis 2019   Chronic cervicalgia.  Failed conservative tx/PT-->[facility]: MRI multilevel DDD, w/multilevel foraminal stenosis, with disc extrusion at C6-7.-->referred to PT.-->got worse, so ESI done 05/2018--helped.   DDD (degenerative disc disease), lumbar    Dysthymia    Epidermoid cyst of skin    GAD (generalized anxiety disorder)    Generalized abdominal pain    recurrent 2020/2021-->GI ref 11/22/19   Hepatic steatosis 06/2019   Noted on CT abd/pelv   Hx of adenomatous polyp of colon 2008; 2013;09/2019   2008 and 2013 Dr. Perfecto Kingdom, [facility] [place], [place]. 2020 Dr. Sabino Donovan 09/2022.   Inguinal hernia recurrent bilateral 10/2017   CT abd/pelv.  Fat-containing.  +Symptomatic.  Referred to gen surg 11/17/17.   Insomnia secondary to anxiety    stress mainly related to work   Left hip pain    ortho 05/2018    Obesity, Class I, BMI 30-34.9    Osteoarthritis of both knees    TKA on left; 10/2017 ([place]).     Past Surgical History:  Procedure Laterality Date   APPENDECTOMY  1968   BACK SURGERY  2012   l4-5 l5 S1   COLONOSCOPY  2008'; 05/20/2012; 09/2019   2008; adenomatous polyps.  2013 Tubular adenoma:--recall 5 yrs.    Diverticulosis -  Dr. Norval Gable. 09/2019 adenomatous polyps->recall 3 yrs.   HERNIA REPAIR  1977   L inguinal   KNEE ARTHROPLASTY Left 09/15/2017   Procedure: LEFT TOTAL KNEE ARTHROPLASTY WITH COMPUTER NAVIGATION;  Surgeon: Samson Frederic, MD;  Location: [facility] OR;  Service: Orthopedics;  Laterality: Left;  Needs RNFA   KNEE ARTHROSCOPY     Bilateral (L x 1, R x 1)   TONSILLECTOMY AND ADENOIDECTOMY  1985   TOTAL SHOULDER REPLACEMENT     Left    TOTAL SHOULDER REPLACEMENT Right 12/10/2017   Done at [facility] in [place], [place].   Patient Active Problem List   Diagnosis Date Noted   Hx of adenomatous polyp of colon    Hepatic steatosis 06/2019   Aortic atherosclerosis (HCC) 06/2019   Inguinal hernia recurrent bilateral 10/2017   Osteoarthritis of left knee 09/15/2017   Lumbar radiculopathy 05/02/2011    ONSET DATE: 09/20/21  REFERRING DIAG: M57.846 (ICD-10-CM) - Left leg weakness  THERAPY DIAG:  Muscle weakness (generalized)  Other abnormalities of gait and mobility  Unsteadiness on feet  Rationale for Evaluation and Treatment: Rehabilitation  SUBJECTIVE:  SUBJECTIVE STATEMENT: Pt states he came back from [place]. Had a good trip. Has been doing his PT every other day. Felt weak yesterday. States legs were cramping yesterday. Pt states he did get a report back from his Holter monitor and found that he did have multiple instances of increased heart rate.   From eval: Pt states he had an 8 level spinal fusion sugery T11-S2 on 09/20/21. Pt states pain was improved but had weakness. Went through PT and able to return to golfing; however, had continued weakness. Had nerve conduction test that demonstrated concern for ALS. Saw ALS specialist in April 2024 but did not think it was definitive. Pt went back in 6  months but nothing significant had occurred except 1 fall. Pt reports he is a little weaker but not enough to do another nerve conduction test. Has not ruled out ALS. Pt states he gets drop foot on L resulting in catching of his toe with walking and difficulty with stairs. Plays golf (Mondays and Thursdays) and walks regularly. Does get spasms at times. Will stub his toe or his knee can buckle.  Pt accompanied by: self  PERTINENT HISTORY: L TKA, spinal fusion T11-S2, history of prior discectomy L1-2  PAIN:  Are you having pain? No  PRECAUTIONS: None  RED FLAGS: None   WEIGHT BEARING RESTRICTIONS: No  FALLS: Has patient fallen in last 6 months? Yes. Number of falls 1 - pt states his foot caught and tripped over his own foot  LIVING ENVIRONMENT: Lives with: lives with their spouse Lives in: House/apartment Stairs: Yes: Internal: 14 steps; on right going up and External: 6 steps; on right going up Has following equipment at home: None  PLOF: Independent  PATIENT GOALS: Improve strength if able  OBJECTIVE:  Note: Objective measures were completed at Evaluation unless otherwise noted.  DIAGNOSTIC FINDINGS: EMG/NCS/NMUS (01/07/2023): This is a complex, abnormal study. There is electrophysiologic evidence of a diffuse, chronic neurogenic process with active denervation and fasciculations affecting the lower motor neurons and/or their axons within the bilateral lumbosacral greater than left cervical segment. Given the diffuse nature of involvement, this may be consistent with the clinical diagnosis of motor neuron disease, such as amyotrophic lateral sclerosis (ALS). Given his clinical history and if only in the legs, this electrophysiologic picture could also be consistent with a polyradiculopathy with ongoing denervation, although this is less likely. There is no electrophysiologic evidence for a left ulnar mononeuropathy.  COORDINATION: Heel to shin WNL  EDEMA:  None but noted L quad  atrophy vs R  LOWER EXTREMITY MMT:    MMT Right Eval Left Eval Left 10/08/23  Hip flexion 5 4- 4  Hip extension 5 3+ 5  Hip abduction 4+ 3 3+  Hip adduction     Hip internal rotation     Hip external rotation     Knee flexion 5 5 5   Knee extension 5 3+ 4  Ankle dorsiflexion 5 3+ 4-  Ankle plantarflexion 5 4 5   Ankle inversion     Ankle eversion     (Blank rows = not tested)   STAIRS: Level of Assistance: Modified independence Stair Negotiation Technique: Step to Pattern with Bilateral Rails Number of Stairs: 4  Height of Stairs: 6"  Comments: increased UE support  GAIT: Gait pattern: step through pattern, decreased stance time- Left, and decreased ankle dorsiflexion- Left Distance walked: Into clinic Assistive device utilized: None Level of assistance: Complete Independence  FUNCTIONAL TESTS:  5 times sit to stand:  16.95 sec (R weight shift)  (eval)  12.03 sec (09/15/23) SLS: 13.28 sec on R, 1.98 sec on L (eval)  18.15 sec on R, 12.94 sec on L (10/08/23) Berg Balance Test: 50/56 (eval)  56/56 (10/08/23) FGA: 22/30 (eval)  28/30 (10/08/23)  [facility] PT Assessment - 10/08/23 0001       Berg Balance Test   Sit to Stand Able to stand without using hands and stabilize independently    Standing Unsupported Able to stand safely 2 minutes    Sitting with Back Unsupported but Feet Supported on Floor or Stool Able to sit safely and securely 2 minutes    Stand to Sit Sits safely with minimal use of hands    Transfers Able to transfer safely, minor use of hands    Standing Unsupported with Eyes Closed Able to stand 10 seconds safely    Standing Unsupported with Feet Together Able to place feet together independently and stand 1 minute safely    From Standing, Reach Forward with Outstretched Arm Can reach confidently >25 cm (10")    From Standing Position, Pick up Object from Floor Able to pick up shoe safely and easily    From Standing Position, Turn to Look Behind Over each  Shoulder Looks behind from both sides and weight shifts well    Turn 360 Degrees Able to turn 360 degrees safely in 4 seconds or less    Standing Unsupported, Alternately Place Feet on Step/Stool Able to stand independently and safely and complete 8 steps in 20 seconds    Standing Unsupported, One Foot in Front Able to place foot tandem independently and hold 30 seconds    Standing on One Leg Able to lift leg independently and hold > 10 seconds    Total Score 56      Functional Gait  Assessment   Gait Level Surface Walks 20 ft in less than 5.5 sec, no assistive devices, good speed, no evidence for imbalance, normal gait pattern, deviates no more than 6 in outside of the 12 in walkway width.    Change in Gait Speed Able to smoothly change walking speed without loss of balance or gait deviation. Deviate no more than 6 in outside of the 12 in walkway width.    Gait with Horizontal Head Turns Performs head turns smoothly with no change in gait. Deviates no more than 6 in outside 12 in walkway width    Gait with Vertical Head Turns Performs head turns with no change in gait. Deviates no more than 6 in outside 12 in walkway width.    Gait and Pivot Turn Pivot turns safely within 3 sec and stops quickly with no loss of balance.    Step Over Obstacle Is able to step over 2 stacked shoe boxes taped together (9 in total height) without changing gait speed. No evidence of imbalance.    Gait with Narrow Base of Support Ambulates 7-9 steps.    Gait with Eyes Closed Walks 20 ft, no assistive devices, good speed, no evidence of imbalance, normal gait pattern, deviates no more than 6 in outside 12 in walkway width. Ambulates 20 ft in less than 7 sec.    Ambulating Backwards Walks 20 ft, no assistive devices, good speed, no evidence for imbalance, normal gait    Steps Alternating feet, must use rail.    Total Score 28               PATIENT SURVEYS:  Tba -- consider PSFS or  LEFS  TODAY'S TREATMENT:                                                                                                                               DATE:  10/08/23 Rechecked goals Eccentric L single leg press 90# 3x8 On black side of bosu: ankle 4 ways x10 On blue side of bosu: ankle DF x10, PF x10  09/24/23 Sci Fit L5 x 5 min UEs/LEs only Piriformis stretch 2x 30" Primal push up 3x20" Squat with ball squeeze, with heels on slant board 3x10 Terminal knee ext blue TB 10x5" Standing SLR blue TB 3x10x3" Standing hip adduction blue TB 3x10x3"   09/15/23 Sci Fit L4 x 5 min LEs only Figure 4 stretch x30" Piriformis stretch 2x30" Checking STGs Tandem stance x30" SLS 3x10" Eccentric forward step downs on aerobic stepper 3x10 with UE support Eccentric side step downs on aerobic stepper 3x10 with UE support Eccentric L single leg press 80# 2x10  09/10/23 Sci Fit L4 x 5 min LEs only Standing gastroc stretch with slant board x30" Standing soleus stretch with slant board x30" Staggered stance DF + eversion green TB 2x10 Side step green TB 2x10 Backwards and forwards monster walk green TB 2x10 Forward tap on cone 2x10    PATIENT EDUCATION: Education details: Exam findings, POC, initial HEP Person educated: Patient Education method: Explanation, Demonstration, and Handouts Education comprehension: verbalized understanding, returned demonstration, and needs further education  HOME EXERCISE PROGRAM: Access Code: [email or web address] URL: [email or web address] Date: 10/08/2023 Prepared by: Vernon Prey April Kirstie Peri  Exercises - Single Leg Heel Raise  - 1 x daily - 7 x weekly - 3 sets - 10 reps - Standing Hip Abduction with Bent Knee  - 1 x daily - 7 x weekly - 3 sets - 10 reps - 3 sec hold - Seated Small Alternating Straight Leg Lifts with Heel Touch  - 1 x daily - 7 x weekly - 3 sets - 10 reps - Standing Hip Extension with Knee Bent  - 1 x daily - 7 x weekly - 3 sets - 10 reps - Seated Piriformis  Stretch  - 1 x daily - 7 x weekly - 2 sets - 30 sec hold - Side Stepping with Resistance at Ankles  - 1 x daily - 7 x weekly - 3 sets - 10 reps - Forward and Backward Monster Walk with Resistance at Ankles and Counter Support  - 1 x daily - 7 x weekly - 3 sets - 10 reps - Forward Step Down Touch with Heel  - 1 x daily - 7 x weekly - 3 sets - 10 reps - Lateral Step Down  - 1 x daily - 7 x weekly - 3 sets - 10 reps - Eccentric Single Leg Press  2 Up, 1 Down  - 1 x daily - 7 x weekly - 2 sets - 10 reps - Primal Push Up  - 1 x daily -  7 x weekly - 3 sets - 20 sec hold - Standing Hip Flexion with Anchored Resistance and Chair Support  - 1 x daily - 7 x weekly - 3 sets - 10 reps - Standing Hip Adduction with Anchored Resistance  - 1 x daily - 7 x weekly - 3 sets - 10 reps  GOALS: Goals reviewed with patient? Yes  SHORT TERM GOALS: Target date: 09/18/2023   Pt will be ind with initial HEP Baseline: Goal status: MET  2.  Pt will have improved 5x STS to </=13 sec to demo increasing functional LE strength Baseline:  Goal status: MET (see above)  3.  PT will administer LEFS or PSFS to obtain baseline Baseline:  Goal status: DEFERRED   LONG TERM GOALS: Target date: 10/09/2023   Pt will be ind with management and progression of HEP Baseline:  Goal status: IN PROGRESS  2.  Pt will have improved Berg Balance to >/=54/56 Baseline: 50 10/08/23: 56 Goal status: MET  3.  Pt will have improved FGA to >/=28/30 to demo low fall risk and MCID Baseline: 22 10/08/23: 28/30 Goal status: MET  4.  Pt will demo at least 4/5 with L LE MMT for improved stair/incline ascent/descent Baseline: See chart above 10/08/23: Most limited with ankle DF and hip abd Goal status: IN PROGRESS  5.  Pt will demo MCID on LEFS or PSFS Baseline:  Goal status: DEFERRED DID NOT ASSESS    ASSESSMENT:  CLINICAL IMPRESSION: Rechecked pt's goals. Pt has met 2/4 goals (1 LTG was deferred due to not getting a  baseline status). Pt has been compliant with his HEP. Has very much improved his static and dynamic balance test scores. Strength has increased as well but remains most limited with his ankle strength and hip abductors. Most increased in his hip extensors. Will finalize plans next session on whether to continue to add a few more sessions or to d/c.   From eval: Patient is a 67 y.o. M who was seen today for physical therapy evaluation and treatment for ongoing L LE weakness. PMH significant for PLIF T11-S1. Pt remains highly active with golf and walking; however, continues to have L LE weakness, decreased L LE endurance, with increased falls/near falls. Assessment significant for L>R LE weakness with noted atrophy leading to decreased L LE stability/balance affecting activities such as stair climbing and ambulating limiting participation in community and recreational activities. Pt remains highly functional but is wanting to perform trial of PT to see if strength can be improved in his L LE or if there remains other conditions at play that will limit progress -- pt's nerve conduction study was concerning for possible ALS which has not been completely ruled out. Pt will benefit from PT for strengthening and balance.   OBJECTIVE IMPAIRMENTS: Abnormal gait, decreased activity tolerance, decreased balance, decreased endurance, decreased mobility, difficulty walking, decreased ROM, decreased strength, increased muscle spasms, improper body mechanics, and postural dysfunction.    PLAN:  PT FREQUENCY: 1-2x/week  PT DURATION: 6 weeks  PLANNED INTERVENTIONS: [id number]- PT Re-evaluation, 97110-Therapeutic exercises, 97530- Therapeutic activity, [id number]- Neuromuscular re-education, 97535- Self Care, [id number]- Manual therapy, [phone]- Gait training, [phone]- Orthotic Fit/training, [phone]- Aquatic Therapy, [phone]- Electrical stimulation (unattended), Patient/Family education, Balance training, Stair training, Taping, Dry Needling,  Joint mobilization, Cryotherapy, and Moist heat  PLAN FOR NEXT SESSION: Assess response to HEP. Continue L LE strengthening and stability.   Dayvion Sans April Ma L Irineo Gaulin, PT 10/08/2023, 4:26 PM

## 2023-10-17 ENCOUNTER — Ambulatory Visit: Payer: Medicare Other | Admitting: Physical Therapy

## 2023-10-24 ENCOUNTER — Ambulatory Visit: Payer: Medicare Other | Attending: Family Medicine | Admitting: Physical Therapy

## 2023-10-24 DIAGNOSIS — R2681 Unsteadiness on feet: Secondary | ICD-10-CM | POA: Insufficient documentation

## 2023-10-24 DIAGNOSIS — R2689 Other abnormalities of gait and mobility: Secondary | ICD-10-CM | POA: Diagnosis present

## 2023-10-24 DIAGNOSIS — M6281 Muscle weakness (generalized): Secondary | ICD-10-CM | POA: Insufficient documentation

## 2023-10-24 NOTE — Therapy (Signed)
 OUTPATIENT PHYSICAL THERAPY NEURO TREATMENT AND HOLD   Patient Name: Anthony Barnett MRN: [id number] DOB:[birth year], 68 y.o., male Today's Date: 10/24/2023   PCP: Jesus Elberta Gainer, FNP REFERRING PROVIDER: Santo Ozell RAMAN, MD  END OF SESSION:  PT End of Session - 10/24/23 1352     Visit Number 7    Number of Visits 6    Date for PT Re-Evaluation 10/09/23    Authorization Type BC[employer]    PT Start Time 1355    PT Stop Time 1440    PT Time Calculation (min) 45 min    Activity Tolerance Patient tolerated treatment well                 Past Medical History:  Diagnosis Date   Allergic rhinitis    Aortic atherosclerosis (HCC) 06/2019   Noted on CT abd/pelv done for abd pain   Cervical spinal stenosis 2019   Chronic cervicalgia.  Failed conservative tx/PT-->[facility]: MRI multilevel DDD, w/multilevel foraminal stenosis, with disc extrusion at C6-7.-->referred to PT.-->got worse, so ESI done 05/2018--helped.   DDD (degenerative disc disease), lumbar    Dysthymia    Epidermoid cyst of skin    GAD (generalized anxiety disorder)    Generalized abdominal pain    recurrent 2020/2021-->GI ref 11/22/19   Hepatic steatosis 06/2019   Noted on CT abd/pelv   Hx of adenomatous polyp of colon 2008; 2013;09/2019   2008 and 2013 Dr. Mela, [facility]--[place], [place] . 2020 Dr. Darreld 09/2022.   Inguinal hernia recurrent bilateral 10/2017   CT abd/pelv.  Fat-containing.  +Symptomatic.  Referred to gen surg 11/17/17.   Insomnia secondary to anxiety    stress mainly related to work   Left hip pain    ortho 05/2018    Obesity, Class I, BMI 30-34.9    Osteoarthritis of both knees    TKA on left; 10/2017 ([place]).     Past Surgical History:  Procedure Laterality Date   APPENDECTOMY  1968   BACK SURGERY  2012   l4-5 l5 S1   COLONOSCOPY  2008'; 05/20/2012; 09/2019   2008; adenomatous polyps.  2013 Tubular adenoma:--recall 5 yrs.     Diverticulosis - Dr. Kurzynski. 09/2019 adenomatous polyps->recall 3 yrs.   HERNIA REPAIR  1977   L inguinal   KNEE ARTHROPLASTY Left 09/15/2017   Procedure: LEFT TOTAL KNEE ARTHROPLASTY WITH COMPUTER NAVIGATION;  Surgeon: Fidel Rogue, MD;  Location: [facility] OR;  Service: Orthopedics;  Laterality: Left;  Needs RNFA   KNEE ARTHROSCOPY     Bilateral (L x 1, R x 1)   TONSILLECTOMY AND ADENOIDECTOMY  1985   TOTAL SHOULDER REPLACEMENT     Left    TOTAL SHOULDER REPLACEMENT Right 12/10/2017   Done at [facility] in [place], [place].   Patient Active Problem List   Diagnosis Date Noted   Hx of adenomatous polyp of colon    Hepatic steatosis 06/2019   Aortic atherosclerosis (HCC) 06/2019   Inguinal hernia recurrent bilateral 10/2017   Osteoarthritis of left knee 09/15/2017   Lumbar radiculopathy 05/02/2011    ONSET DATE: 09/20/21  REFERRING DIAG: M70.101 (ICD-10-CM) - Left leg weakness  THERAPY DIAG:  Muscle weakness (generalized)  Other abnormalities of gait and mobility  Unsteadiness on feet  Rationale for Evaluation and Treatment: Rehabilitation  SUBJECTIVE:  SUBJECTIVE STATEMENT: Pt states he has been consistent with his PT and walking. Will be seeing cardiologist 11/26/23. Still will have good and bad days with his fatigue.   From eval: Pt states he had an 8 level spinal fusion sugery T11-S2 on 09/20/21. Pt states pain was improved but had weakness. Went through PT and able to return to golfing; however, had continued weakness. Had nerve conduction test that demonstrated concern for ALS. Saw ALS specialist in April 2024 but did not think it was definitive. Pt went back in 6 months but nothing significant had occurred except 1 fall. Pt reports he is a little weaker but not enough to do another  nerve conduction test. Has not ruled out ALS. Pt states he gets drop foot on L resulting in catching of his toe with walking and difficulty with stairs. Plays golf (Mondays and Thursdays) and walks regularly. Does get spasms at times. Will stub his toe or his knee can buckle.  Pt accompanied by: self  PERTINENT HISTORY: L TKA, spinal fusion T11-S2, history of prior discectomy L1-2  PAIN:  Are you having pain? No  PRECAUTIONS: None  RED FLAGS: None   WEIGHT BEARING RESTRICTIONS: No  FALLS: Has patient fallen in last 6 months? Yes. Number of falls 1 - pt states his foot caught and tripped over his own foot  LIVING ENVIRONMENT: Lives with: lives with their spouse Lives in: House/apartment Stairs: Yes: Internal: 14 steps; on right going up and External: 6 steps; on right going up Has following equipment at home: None  PLOF: Independent  PATIENT GOALS: Improve strength if able  OBJECTIVE:  Note: Objective measures were completed at Evaluation unless otherwise noted.  DIAGNOSTIC FINDINGS: EMG/NCS/NMUS (01/07/2023): This is a complex, abnormal study. There is electrophysiologic evidence of a diffuse, chronic neurogenic process with active denervation and fasciculations affecting the lower motor neurons and/or their axons within the bilateral lumbosacral greater than left cervical segment. Given the diffuse nature of involvement, this may be consistent with the clinical diagnosis of motor neuron disease, such as amyotrophic lateral sclerosis (ALS). Given his clinical history and if only in the legs, this electrophysiologic picture could also be consistent with a polyradiculopathy with ongoing denervation, although this is less likely. There is no electrophysiologic evidence for a left ulnar mononeuropathy.  COORDINATION: Heel to shin WNL  EDEMA:  None but noted L quad atrophy vs R  LOWER EXTREMITY MMT:    MMT Right Eval Left Eval Left 10/08/23  Hip flexion 5 4- 4  Hip extension 5  3+ 5  Hip abduction 4+ 3 3+  Hip adduction     Hip internal rotation     Hip external rotation     Knee flexion 5 5 5   Knee extension 5 3+ 4  Ankle dorsiflexion 5 3+ 4-  Ankle plantarflexion 5 4 5   Ankle inversion     Ankle eversion     (Blank rows = not tested)   STAIRS: Level of Assistance: Modified independence Stair Negotiation Technique: Step to Pattern with Bilateral Rails Number of Stairs: 4  Height of Stairs: 6  Comments: increased UE support  GAIT: Gait pattern: step through pattern, decreased stance time- Left, and decreased ankle dorsiflexion- Left Distance walked: Into clinic Assistive device utilized: None Level of assistance: Complete Independence  FUNCTIONAL TESTS:  5 times sit to stand: 16.95 sec (R weight shift)  (eval)  12.03 sec (09/15/23) SLS: 13.28 sec on R, 1.98 sec on L (eval)  18.15 sec on  R, 12.94 sec on L (10/08/23) Berg Balance Test: 50/56 (eval)  56/56 (10/08/23) FGA: 22/30 (eval)  28/30 (10/08/23)      PATIENT SURVEYS:  Tba -- consider PSFS or LEFS  TODAY'S TREATMENT:                                                                                                                              DATE:  10/24/23 Elliptical 3 min fwd, 3 min bwd Standing quad stretch x 30 R&L On blue side of bosu ankle 4 ways x10 R&L Lunges on to blue side of bosu 2x10  10/08/23 Rechecked goals Eccentric L single leg press 90# 3x8 On black side of bosu: ankle 4 ways x10 On blue side of bosu: ankle DF x10, PF x10  09/24/23 Sci Fit L5 x 5 min UEs/LEs only Piriformis stretch 2x 30 Primal push up 3x20 Squat with ball squeeze, with heels on slant board 3x10 Terminal knee ext blue TB 10x5 Standing SLR blue TB 3x10x3 Standing hip adduction blue TB 3x10x3   09/15/23 Sci Fit L4 x 5 min LEs only Figure 4 stretch x30 Piriformis stretch 2x30 Checking STGs Tandem stance x30 SLS 3x10 Eccentric forward step downs on aerobic stepper 3x10 with UE  support Eccentric side step downs on aerobic stepper 3x10 with UE support Eccentric L single leg press 80# 2x10  09/10/23 Sci Fit L4 x 5 min LEs only Standing gastroc stretch with slant board x30 Standing soleus stretch with slant board x30 Staggered stance DF + eversion green TB 2x10 Side step green TB 2x10 Backwards and forwards monster walk green TB 2x10 Forward tap on cone 2x10    PATIENT EDUCATION: Education details: Exam findings, POC, initial HEP Person educated: Patient Education method: Explanation, Demonstration, and Handouts Education comprehension: verbalized understanding, returned demonstration, and needs further education  HOME EXERCISE PROGRAM: Access Code: [email or web address] URL: [email or web address] Date: 10/08/2023 Prepared by: Marqueta Pulley April Earnie Starring  Exercises - Single Leg Heel Raise  - 1 x daily - 7 x weekly - 3 sets - 10 reps - Standing Hip Abduction with Bent Knee  - 1 x daily - 7 x weekly - 3 sets - 10 reps - 3 sec hold - Seated Small Alternating Straight Leg Lifts with Heel Touch  - 1 x daily - 7 x weekly - 3 sets - 10 reps - Standing Hip Extension with Knee Bent  - 1 x daily - 7 x weekly - 3 sets - 10 reps - Seated Piriformis Stretch  - 1 x daily - 7 x weekly - 2 sets - 30 sec hold - Side Stepping with Resistance at Ankles  - 1 x daily - 7 x weekly - 3 sets - 10 reps - Forward and Backward Monster Walk with Resistance at Ankles and Counter Support  - 1 x daily - 7 x weekly - 3 sets - 10 reps - Forward Step Down Touch with Heel  - 1 x  daily - 7 x weekly - 3 sets - 10 reps - Lateral Step Down  - 1 x daily - 7 x weekly - 3 sets - 10 reps - Eccentric Single Leg Press  2 Up, 1 Down  - 1 x daily - 7 x weekly - 2 sets - 10 reps - Primal Push Up  - 1 x daily - 7 x weekly - 3 sets - 20 sec hold - Standing Hip Flexion with Anchored Resistance and Chair Support  - 1 x daily - 7 x weekly - 3 sets - 10 reps - Standing Hip Adduction with Anchored  Resistance  - 1 x daily - 7 x weekly - 3 sets - 10 reps  GOALS: Goals reviewed with patient? Yes  SHORT TERM GOALS: Target date: 09/18/2023   Pt will be ind with initial HEP Baseline: Goal status: MET  2.  Pt will have improved 5x STS to </=13 sec to demo increasing functional LE strength Baseline:  Goal status: MET (see above)  3.  PT will administer LEFS or PSFS to obtain baseline Baseline:  Goal status: DEFERRED   LONG TERM GOALS: Target date: 10/09/2023   Pt will be ind with management and progression of HEP Baseline:  Goal status: MET  2.  Pt will have improved Berg Balance to >/=54/56 Baseline: 50 10/08/23: 56 Goal status: MET  3.  Pt will have improved FGA to >/=28/30 to demo low fall risk and MCID Baseline: 22 10/08/23: 28/30 Goal status: MET  4.  Pt will demo at least 4/5 with L LE MMT for improved stair/incline ascent/descent Baseline: See chart above 10/08/23: Most limited with ankle DF and hip abd Goal status: IN PROGRESS  5.  Pt will demo MCID on LEFS or PSFS Baseline:  Goal status: DEFERRED DID NOT ASSESS    ASSESSMENT:  CLINICAL IMPRESSION: Finalized HEP for pt to address muscles that are still his weakest. Discussed putting his PT on hold at this time until after his doctor appointments and to just perform HEP. Will try to follow up again in a month's time and re-evaluate.   From eval: Patient is a 68 y.o. M who was seen today for physical therapy evaluation and treatment for ongoing L LE weakness. PMH significant for PLIF T11-S1. Pt remains highly active with golf and walking; however, continues to have L LE weakness, decreased L LE endurance, with increased falls/near falls. Assessment significant for L>R LE weakness with noted atrophy leading to decreased L LE stability/balance affecting activities such as stair climbing and ambulating limiting participation in community and recreational activities. Pt remains highly functional but is wanting  to perform trial of PT to see if strength can be improved in his L LE or if there remains other conditions at play that will limit progress -- pt's nerve conduction study was concerning for possible ALS which has not been completely ruled out. Pt will benefit from PT for strengthening and balance.   OBJECTIVE IMPAIRMENTS: Abnormal gait, decreased activity tolerance, decreased balance, decreased endurance, decreased mobility, difficulty walking, decreased ROM, decreased strength, increased muscle spasms, improper body mechanics, and postural dysfunction.    PLAN:  PT FREQUENCY: 1-2x/week  PT DURATION: 6 weeks  PLANNED INTERVENTIONS: [id number]- PT Re-evaluation, 97110-Therapeutic exercises, 97530- Therapeutic activity, 97112- Neuromuscular re-education, 97535- Self Care, [id number]- Manual therapy, [phone]- Gait training, [phone]- Orthotic Fit/training, [phone]- Aquatic Therapy, [phone]- Electrical stimulation (unattended), Patient/Family education, Balance training, Stair training, Taping, Dry Needling, Joint mobilization, Cryotherapy, and Moist heat  PLAN  FOR NEXT SESSION: Assess response to HEP. Continue L LE strengthening and stability.   Britany Callicott April Ma L Amarie Viles, PT 10/24/2023, 1:53 PM

## 2023-12-02 ENCOUNTER — Encounter: Payer: Self-pay | Admitting: Physical Therapy

## 2024-05-28 ENCOUNTER — Other Ambulatory Visit: Payer: Self-pay | Admitting: Medical Genetics

## 2024-06-23 ENCOUNTER — Other Ambulatory Visit (HOSPITAL_COMMUNITY)

## 2024-08-11 ENCOUNTER — Other Ambulatory Visit (HOSPITAL_COMMUNITY)

## 2024-09-30 ENCOUNTER — Other Ambulatory Visit

## 2024-11-25 ENCOUNTER — Other Ambulatory Visit: Payer: Self-pay | Admitting: Medical Genetics

## 2024-11-25 DIAGNOSIS — Z006 Encounter for examination for normal comparison and control in clinical research program: Secondary | ICD-10-CM
